# Patient Record
Sex: Female | Born: 1940 | Race: White | Marital: Married
Health system: Southern US, Community
[De-identification: ages and names within clinical notes are randomized; demographics above are authoritative.]

---

## 2006-01-25 ENCOUNTER — Other Ambulatory Visit: Payer: Self-pay

## 2006-02-01 ENCOUNTER — Inpatient Hospital Stay: Payer: Self-pay | Admitting: Unknown Physician Specialty

## 2008-10-28 ENCOUNTER — Inpatient Hospital Stay: Payer: Self-pay | Admitting: Internal Medicine

## 2008-12-25 ENCOUNTER — Ambulatory Visit: Payer: Self-pay | Admitting: Internal Medicine

## 2009-01-16 ENCOUNTER — Ambulatory Visit: Payer: Self-pay | Admitting: Internal Medicine

## 2009-01-25 ENCOUNTER — Ambulatory Visit: Payer: Self-pay | Admitting: Internal Medicine

## 2009-02-24 ENCOUNTER — Ambulatory Visit: Payer: Self-pay

## 2009-02-25 ENCOUNTER — Ambulatory Visit: Payer: Self-pay | Admitting: Internal Medicine

## 2009-05-19 ENCOUNTER — Ambulatory Visit: Payer: Self-pay | Admitting: Pain Medicine

## 2009-06-16 ENCOUNTER — Ambulatory Visit: Payer: Self-pay | Admitting: Physician Assistant

## 2009-06-30 ENCOUNTER — Ambulatory Visit: Payer: Self-pay | Admitting: Pain Medicine

## 2012-10-16 ENCOUNTER — Ambulatory Visit: Payer: Self-pay | Admitting: Family Medicine

## 2013-06-13 ENCOUNTER — Inpatient Hospital Stay: Payer: Self-pay | Admitting: Specialist

## 2013-06-13 DIAGNOSIS — R030 Elevated blood-pressure reading, without diagnosis of hypertension: Secondary | ICD-10-CM

## 2013-06-13 LAB — BASIC METABOLIC PANEL
Anion Gap: 6 — ABNORMAL LOW (ref 7–16)
Calcium, Total: 9.6 mg/dL (ref 8.5–10.1)
Chloride: 100 mmol/L (ref 98–107)
Co2: 29 mmol/L (ref 21–32)
EGFR (African American): >60
EGFR (Non-African Amer.): >60
Glucose: 140 mg/dL — ABNORMAL HIGH (ref 65–99)
Osmolality: 275 (ref 275–301)
Sodium: 135 mmol/L — ABNORMAL LOW (ref 136–145)

## 2013-06-13 LAB — URINALYSIS, COMPLETE
Bacteria: NONE SEEN
Bilirubin,UR: NEGATIVE
Blood: NEGATIVE
Hyaline Cast: 3
Ph: 5 (ref 4.5–8.0)
Protein: NEGATIVE
RBC,UR: 1 /HPF (ref 0–5)
Specific Gravity: 1.016 (ref 1.003–1.030)
Squamous Epithelial: 1
WBC UR: 2 /HPF (ref 0–5)

## 2013-06-13 LAB — CBC
MCHC: 32.6 g/dL (ref 32.0–36.0)
MCV: 87 fL (ref 80–100)
Platelet: 238 10*3/uL (ref 150–440)
RBC: 3.74 10*6/uL — ABNORMAL LOW (ref 3.80–5.20)
WBC: 11.7 10*3/uL — ABNORMAL HIGH (ref 3.6–11.0)

## 2013-06-13 LAB — PROTIME-INR: INR: 0.9

## 2013-06-13 LAB — TROPONIN I: Troponin-I: <0.02 ng/mL

## 2013-06-15 LAB — BASIC METABOLIC PANEL
Anion Gap: 4 — ABNORMAL LOW (ref 7–16)
Calcium, Total: 8.7 mg/dL (ref 8.5–10.1)
Co2: 30 mmol/L (ref 21–32)
Creatinine: 0.6 mg/dL (ref 0.60–1.30)
Glucose: 174 mg/dL — ABNORMAL HIGH (ref 65–99)
Osmolality: 280 (ref 275–301)
Sodium: 138 mmol/L (ref 136–145)

## 2013-06-15 LAB — CBC WITH DIFFERENTIAL/PLATELET
Basophil #: 0 x10 3/mm 3 (ref 0.0–0.1)
Basophil %: 0.1 %
Eosinophil #: 0 x10 3/mm 3 (ref 0.0–0.7)
Eosinophil %: 0 %
HCT: 31.5 % — ABNORMAL LOW (ref 35.0–47.0)
HGB: 10.5 g/dL — ABNORMAL LOW (ref 12.0–16.0)
Lymphocyte %: 7 %
Lymphs Abs: 0.8 x10 3/mm 3 — ABNORMAL LOW (ref 1.0–3.6)
MCH: 28.9 pg (ref 26.0–34.0)
MCHC: 33.3 g/dL (ref 32.0–36.0)
MCV: 87 fL (ref 80–100)
Monocyte #: 0.9 x10 3/mm (ref 0.2–0.9)
Monocyte %: 7.2 %
Neutrophil #: 10.3 x10 3/mm 3 — ABNORMAL HIGH (ref 1.4–6.5)
Neutrophil %: 85.7 %
Platelet: 162 x10 3/mm 3 (ref 150–440)
RBC: 3.63 X10 6/mm 3 — ABNORMAL LOW (ref 3.80–5.20)
RDW: 14.2 % (ref 11.5–14.5)
WBC: 12 x10 3/mm 3 — ABNORMAL HIGH (ref 3.6–11.0)

## 2013-06-16 LAB — BASIC METABOLIC PANEL
Anion Gap: 1 — ABNORMAL LOW (ref 7–16)
Chloride: 104 mmol/L (ref 98–107)
EGFR (African American): >60
Potassium: 3.8 mmol/L (ref 3.5–5.1)

## 2013-06-16 LAB — CBC WITH DIFFERENTIAL/PLATELET
Basophil #: 0 10*3/uL (ref 0.0–0.1)
Basophil %: 0.4 %
Eosinophil #: 0.3 10*3/uL (ref 0.0–0.7)
Eosinophil %: 2.9 %
HCT: 32.1 % — ABNORMAL LOW (ref 35.0–47.0)
HGB: 10.5 g/dL — ABNORMAL LOW (ref 12.0–16.0)
Lymphocyte #: 2 10*3/uL (ref 1.0–3.6)
Lymphocyte %: 16.8 %
MCH: 28.7 pg (ref 26.0–34.0)
Monocyte %: 9.7 %
Neutrophil #: 8.3 10*3/uL — ABNORMAL HIGH (ref 1.4–6.5)
Neutrophil %: 70.2 %
RBC: 3.65 10*6/uL — ABNORMAL LOW (ref 3.80–5.20)
WBC: 11.8 10*3/uL — ABNORMAL HIGH (ref 3.6–11.0)

## 2013-06-20 ENCOUNTER — Encounter: Payer: Self-pay | Admitting: Internal Medicine

## 2013-06-27 ENCOUNTER — Encounter: Payer: Self-pay | Admitting: Internal Medicine

## 2014-10-17 NOTE — Op Note (Signed)
PATIENT NAME:  Sierra [name], Sierra [name] MR#:  [id number] DATE OF BIRTH:  03-Jul-1940  DATE OF PROCEDURE:  06/14/2013  PREOPERATIVE DIAGNOSIS: Transverse fractures of the proximal left tibia and fibula.   POSTOPERATIVE DIAGNOSIS: Transverse fractures of the proximal left tibia and fibula.   PROCEDURE PERFORMED: Open reduction, internal fixation, left tibia fracture (Synthes 3.5 mm proximal locking tibial plate, 9 locking screws, and two cortical screws).   SURGEON: Valinda HoarHoward E. Lutie Pickler, [name].D.   ANESTHESIA: Spinal.   COMPLICATIONS: None.   DRAINS: Two Hemovacs.   ESTIMATED BLOOD LOSS: 100 mL   REPLACED: None in the Operating Room.   DESCRIPTION OF PROCEDURE: The patient was brought to the Operating Room, where she underwent satisfactory spinal anesthesia and was placed in the supine position on the fracture table. The left leg was prepped and draped in sterile fashion. An Esmarch was applied. The tourniquet was inflated to 350 mmHg. Tourniquet time was 81 minutes.   A hockey stick shaped incision was made of the proximal lateral aspect of the tibia, and dissection carried out bluntly to subcutaneous tissue. Large amount of hematoma was removed. The lateral border of the tibia was exposed and dissection was brought up proximally on the lateral tibial plateau. The fracture site was identified and was distracted. It was irrigated and curetted free of clot. The fracture was reduced manually, in good position.   An eight hole, 3.5 mm proximal tibial locking plate was applied to the lateral side of the tibia, and initial fixation made with a cortical screw below the fracture site. The fracture was reduced, and the fluoroscopy showed the hardware to be in good position. The plate was then affixed to the proximal and distal fragments, using locking screws proximally and a combination of locking and cortical screws distally. The fracture site appeared to be well reduced on AP and lateral views, and hardware was  all in good position.   The wound was irrigated and closed with 0 Quill over a medium Hemovac drain in the muscle compartment and 0 Quill over another Hemovac drain in the subcutaneous tissue. Skin was closed with staples. A dry sterile dressing, Polar Care and knee immobilizer were reapplied. The tourniquet was deflated, with good return of blood flow to the foot. The patient was transferred to her hospital bed and taken to the recovery room in satisfactory position.   The patient was hypotensive, and it was felt that, since her preoperative hemoglobin was only 8.6, and since she was going to bleed significantly from this surgery, that transfusion with 2 units of packed red blood cells was indicated. These were supposed to be started in the Operating Room, but due to delay, they will be started in the PACU.     ____________________________ Valinda HoarHoward E. Pink Maye, MD hem:cg D: 06/14/2013 16:54:28 ET T: 06/15/2013 01:30:31 ET JOB#: [id number]  cc: Valinda HoarHoward E. Stelios Kirby, MD, <Dictator> Valinda HoarHOWARD E Daurice Ovando MD ELECTRONICALLY SIGNED 06/15/2013 17:16

## 2014-10-17 NOTE — H&P (Signed)
Subjective/Chief Complaint Pain left leg   History of Present Illness 74 year old female fell at home last night when she slipped in kitchen.  Did not come to Emergency Room until this AM where exam and X-rays show a displaced proximal transverse fracture of the left tibia and fibula.  Admitted for surgical stabilization and medical evaluation.  Cleared by Dr Posey Pronto medically for surgery.  Risks and benefits of surgery were discussed at length including but not limited to infection, non union, nerve or blood vessed damage, non union, need for repeat surgery, blood clots and lung emboli, and death.  Plan open reduction and internal fixation of left tibia tomorrow,   Past Med/Surgical Hx:  anxiety disorder:   catracts:   breast  limps and cysts:   anemia:   osteoporosis:   gerd:   depression:   htn:   lens replacement of eyes:   tonsilectomy:   tonsillectomy:   back  repair:   carpal tunnel surgery:   right rotator cuff repair:   right total knee replacement:   ALLERGIES:  Latex: Blisters  Family and Social History:  Family History Non-Contributory   Social History negative tobacco, negative ETOH   Place of Living Home   Review of Systems:  Fever/Chills No   Cough No   Sputum No   Abdominal Pain No   Physical Exam:  GEN well developed, well nourished, no acute distress   HEENT pink conjunctivae   NECK supple   RESP normal resp effort   CARD regular rate   ABD denies tenderness   LYMPH negative neck   EXTR Left leg painful to palpation at knee and below.  Ecchymosis present.  circulation/sensation/motor function good and skin intact.  Some distal edema.  Pain with range of motion.   SKIN normal to palpation   NEURO motor/sensory function intact   PSYCH alert, A+O to time, place, person, good insight   Lab Results: Routine BB:  18-Dec-14 14:07   ABO Group + Rh Type O Positive  Antibody Screen NEGATIVE (Result(s) reported on 13 Jun 2013 at 04:43PM.)   Routine Chem:  18-Dec-14 10:41   Glucose, Serum  140  BUN  19  Creatinine (comp) 0.65  Sodium, Serum  135  Potassium, Serum 3.8  Chloride, Serum 100  CO2, Serum 29  Calcium (Total), Serum 9.6  Anion Gap  6  Osmolality (calc) 275  eGFR (African American) >60  eGFR (Non-African American) >60 (eGFR values <48m/min/1.73 m2 may be an indication of chronic kidney disease (CKD). Calculated eGFR is useful in patients with stable renal function. The eGFR calculation will not be reliable in acutely ill patients when serum creatinine is changing rapidly. It is not useful in  patients on dialysis. The eGFR calculation may not be applicable to patients at the low and high extremes of body sizes, pregnant women, and vegetarians.)  Cardiac:  18-Dec-14 14:07   CK, Total 93  CPK-MB, Serum 2.5 (Result(s) reported on 13 Jun 2013 at 02:42PM.)  Troponin I < 0.02 (0.00-0.05 0.05 ng/mL or less: NEGATIVE  Repeat testing in 3-6 hrs  if clinically indicated. >0.05 ng/mL: POTENTIAL  MYOCARDIAL INJURY. Repeat  testing in 3-6 hrs if  clinically indicated. NOTE: An increase or decrease  of 30% or more on serial  testing suggests a  clinically important change)  Routine UA:  18-Dec-14 11:50   Color (UA) Yellow  Clarity (UA) Clear  Glucose (UA) Negative  Bilirubin (UA) Negative  Ketones (UA) Negative  Specific Gravity (UA) 1.016  Blood (UA) Negative  pH (UA) 5.0  Protein (UA) Negative  Nitrite (UA) Negative  Leukocyte Esterase (UA) Negative (Result(s) reported on 13 Jun 2013 at 12:16PM.)  RBC (UA) 1 /HPF  WBC (UA) 2 /HPF  Bacteria (UA) NONE SEEN  Epithelial Cells (UA) 1 /HPF  Mucous (UA) PRESENT  Hyaline Cast (UA) 3 /LPF (Result(s) reported on 13 Jun 2013 at 12:16PM.)  Routine Coag:  18-Dec-14 10:41   Prothrombin 12.3  INR 0.9 (INR reference interval applies to patients on anticoagulant therapy. A single INR therapeutic range for coumarins is not optimal for all indications; however,  the suggested range for most indications is 2.0 - 3.0. Exceptions to the INR Reference Range may include: Prosthetic heart valves, acute myocardial infarction, prevention of myocardial infarction, and combinations of aspirin and anticoagulant. The need for a higher or lower target INR must be assessed individually. Reference: The Pharmacology and Management of the Vitamin K  antagonists: the seventh ACCP Conference on Antithrombotic and Thrombolytic Therapy. CBSWH.6759 Sept:126 (3suppl): N9146842. A HCT value >55% may artifactually increase the PT.  In one study,  the increase was an average of 25%. Reference:  "Effect on Routine and Special Coagulation Testing Values of Citrate Anticoagulant Adjustment in Patients with High HCT Values." American Journal of Clinical Pathology 2006;126:400-405.)  Routine Hem:  18-Dec-14 10:41   WBC (CBC)  11.7  RBC (CBC)  3.74  Hemoglobin (CBC)  10.6  Hematocrit (CBC)  32.6  Platelet Count (CBC) 238 (Result(s) reported on 13 Jun 2013 at 11:33AM.)  MCV 87  MCH 28.4  MCHC 32.6  RDW 13.8   Radiology Results: XRay:    18-Dec-14 09:50, Tibia And Fibula Left  Tibia And Fibula Left  REASON FOR EXAM:    Fall  COMMENTS:       PROCEDURE: DXR - DXR TIBIA AND FIBULA LT (LOWER L  - Jun 13 2013  9:50AM     CLINICAL DATA:  Status post fall    EXAM:  LEFT TIBIA AND FIBULA - 2 VIEW    COMPARISON:  None.    FINDINGS:  There is a transversemildly comminuted fracture of the proximal  left tibial metadiaphysis with 8 mm of medial displacement and 6 mm  of posterior displacement. There is a mildly comminuted fracture of  the proximal fibular metadiaphysis. There is mild soft tissue edema.    IMPRESSION:  Transverse mildly comminuted fracture of the proximal left tibial  metadiaphysis with 8 mm of medial displacement and 6 mm of posterior  displacement.    Mildly comminuted fracture of the proximal fibular metadiaphysis.      Electronically Signed     By: Kathreen Devoid    On: 06/13/2013 10:25     Verified By: Jennette Banker, M.D., MD  LabUnknown:  PACS Image    Assessment/Admission Diagnosis Unstable left proximal tibia/fibula fractures.   Plan open reduction and internal fixation left tibia tomorrow.   Electronic Signatures: Park Breed (MD)  (Signed 18-Dec-14 17:57)  Authored: CHIEF COMPLAINT and HISTORY, PAST MEDICAL/SURGIAL HISTORY, ALLERGIES, FAMILY AND SOCIAL HISTORY, REVIEW OF SYSTEMS, PHYSICAL EXAM, LABS, Radiology, ASSESSMENT AND PLAN   Last Updated: 18-Dec-14 17:57 by Park Breed (MD)

## 2014-10-17 NOTE — Discharge Summary (Signed)
PATIENT NAME:  Sierra [name], Sierra [name] MR#:  [id number] DATE OF BIRTH:  17-Aug-1940  DATE OF ADMISSION:  06/13/2013 DATE OF DISCHARGE:  06/18/2013  FINAL DIAGNOSES: 1.  Comminuted displaced fracture of left proximal tibia/fibula.  2.  Anxiety disorder.  3.  Anemia.  4.  Osteoporosis.  5.  Gastroesophageal reflux disease.  6.  Depression.  7.  Hypertension.  OPERATION: On 06/14/2013, open reduction and internal fixation, left proximal tibia with a Synthes proximal tibial locking plate.   COMPLICATIONS: None.   CONSULTATIONS: None.   DISCHARGE MEDICATIONS: Norco 7.5/325 p.r.n. pain, Neurontin 400 mg b.i.d., enteric-coated aspirin 1 p.o. b.i.d., iron 1 p.o. daily, milk of magnesia p.r.n.   HISTORY OF PRESENT ILLNESS: The patient is a 74 year old female who tripped and fell in her kitchen the night before she was admitted. She had pain and swelling in the leg and was brought to the Emergency Room the next day where exam and x-rays revealed transverse, mildly displaced fractures of the left proximal tibia and fibula. No other orthopedic injuries were noted. The patient was admitted for medical evaluation and clearance and surgical fixation of the fracture. She lives alone, and it was felt that she would need extensive rehab before she could go home again.    PAST MEDICAL HISTORY AND ILLNESSES:  As above.  ALLERGIES: LATEX.   HOME MEDICATIONS:  None were listed.   OPERATIONS:  Cataract surgery, tonsillectomy, back surgery, carpal tunnel surgery, right rotator cuff surgery, right total knee replacement.   FAMILY HISTORY: Unremarkable.   SOCIAL HISTORY: The patient lives alone. Does not smoke or drink.   REVIEW OF SYSTEMS: Unremarkable.   PHYSICAL EXAMINATION: Vital signs were normal. The patient was alert and cooperative. The left leg was swollen and ecchymotic.  The neurovascular status was good. The fracture was unstable, and there was pain with movement. The skin was intact.    Laboratory data on admission was satisfactory.   HOSPITAL COURSE:  On 06/14/2013, the patient was taken to surgery where she underwent an open reduction and internal fixation of the left proximal tibia. Her hemoglobin had dropped to 8.6 on the day of surgery, and she was somewhat hypotensive. She was transfused 2 units of packed cells. Postoperatively, she did well. The Hemovac was removed on the first postop day. Her hemoglobin increased to 10.5 on the first and second postop days. She was gradually mobilized. She was making very slow progress with PT. She is markedly overweight with a BMI of 38 and poor muscle strength; will require extensive skilled nursing stay prior to returning home. We are awaiting clearance from the insurance company for skilled nursing placement, but she is stable and ready for discharge today, 06/18/2013. She is to be seen in my office in 2 weeks for exam. She is to be touchdown weight-bearing on the left leg only.    ____________________________ Sierra HoarHoward E. Edwinna Rochette, MD hem:dmm D: 06/18/2013 10:52:50 ET T: 06/18/2013 11:08:16 ET JOB#: [id number]  cc: Sierra HoarHoward E. Schneur Crowson, MD, <Dictator> Rhona LeavensJames F. Burnett ShengHedrick, MD Sierra HoarHOWARD E Reka Wist MD ELECTRONICALLY SIGNED 06/19/2013 8:50

## 2014-10-17 NOTE — Consult Note (Signed)
Brief Consult Note: Diagnosis: left proximal tibial-fibulae and left medial malleolus fracture s/p accidental fall y'day at home, COPD on 2 liter [place] oxygen, Elelvated BP w/o dx of HTN.   Patient was seen by consultant.   Consult note dictated.   Recommend to proceed with surgery or procedure.   Comments: -check EKG -pt has no cardiac history of MI or HTN. Her bp is elelvated likley due to pain. will start bp meds if remains elevated. -OK to proceed for sugery. low to intermediate risk -DVT prophylaxis -Incentive spirometry.  Electronic Signatures: Willow OraPatel, Mancel Lardizabal A (MD)  (Signed 18-Dec-14 12:40)  Authored: Brief Consult Note   Last Updated: 18-Dec-14 12:40 by Willow OraPatel, Evangela Heffler A (MD)

## 2014-10-18 NOTE — Consult Note (Signed)
PATIENT NAME:  Sierra [name], Sierra [name] MR#:  [id number] DATE OF BIRTH:  1941/03/21  DATE OF CONSULTATION:  06/13/2013  REFERRING PHYSICIAN:  Dr. Deeann SaintHoward Miller CONSULTING PHYSICIAN:  Kunaal Walkins A. Allena KatzPatel, MD  PRIMARY CARE PHYSICIAN:  Dr. Tera MaterJim Hedrick  REASON FOR CONSULTATION:  Preop medical clearance. The patient is status post accidental fall at home.  Sierra [name] is a 74 year old Caucasian female with past medical history of COPD on 2 liter nasal cannula oxygen continuous, along with history of DJD with spinal stenosis and back surgery in the past, comes to the Emergency Room after she had sustained a fall in her kitchen when she slipped over plastic material down in her kitchen. The patient started having pain in her left leg. She heard the knee "pop," came to the Emergency Room and was found to have left proximal tibia-fibular fracture and avulsion fracture of the left malleolus. The patient is seen by orthopedist Dr. Hyacinth MeekerMiller, is scheduled to get open reduction and internal fixation and internal medicine was consulted for preoperative medical clearance.  The patient lives at home by herself. She drives. She uses a cane to walk around secondary to DJD and back pain. She also uses 2 liters of nasal cannula oxygen. She is otherwise active, does not have any cardiac history or any history of MI, arrhythmia or hypertension in the past. She does not take any medications other than occasionally BC powders. She has some shortness of breath, but that is due to her chronic COPD, for which she uses oxygen.   PAST MEDICAL HISTORY: 1.  COPD on 2 liters nasal cannula oxygen.  2.  DJD. 3.  Total right knee replacement.  4.  Back surgery.  5.  Spinal stenosis of lower back.  ALLERGIES: LATEX.  OUTPATIENT MEDICATIONS: No current medications except occasionally BC powders over-the-counter. The patient denies use of any aspirin, NSAIDs or any multivitamins either.   FAMILY HISTORY: Positive for lung cancer in  grandfather and mother. Grandmother had brain tumor.   SOCIAL HISTORY: She lives at home by herself, uses 2 liters of nasal cannula oxygen. She is an ex-smoker, denies alcohol use.  REVIEW OF SYSTEMS:   CONSTITUTIONAL: Positive. No fever, fatigue, weakness.  EYES: No blurred or double vision, glaucoma or cataracts.  ENT: No tinnitus, ear pain, hearing loss.  RESPIRATORY: No cough, wheeze, hemoptysis. Positive for COPD.  CARDIOVASCULAR: No chest pain, orthopnea, edema. Positive for dyspnea on exertion occasionally.  GASTROINTESTINAL: No nausea, vomiting, diarrhea, abdominal pain.  GENITOURINARY: No dysuria, hematuria or frequency.  ENDOCRINE: No polyuria, nocturia or thyroid problems.  HEMATOLOGY: No anemia or easy bruising.  SKIN: No acne or rash.  MUSCULOSKELETAL: Positive for arthritis, back pain which limits sometimes her activities.  NEUROLOGIC: No CVA, TIA, ataxia or dementia.  PSYCHIATRIC: No anxiety or depression. All other systems reviewed are negative.   PHYSICAL EXAMINATION: GENERAL: The patient is awake, alert, oriented x 3, not in acute distress.  VITAL SIGNS: Afebrile. Pulse is 91. Blood pressure is 142/65. Sats are 95% on 2 liters nasal cannula oxygen.  HEENT: Atraumatic, normocephalic. Pupils PERRLA. EOMI intact. Oral mucosa is dry.  NECK: Supple. No JVD. No carotid bruit.  RESPIRATORY: Clear to auscultation bilaterally. No rales, rhonchi, respiratory distress or labored breathing.  CARDIOVASCULAR: Both heart sounds are normal. Mild tachycardia present. No murmur heard. PMI not lateralized. CHEST:  Nontender.  EXTREMITIES: Good pedal pulses, good femoral pulses. No lower extremity edema.  ABDOMEN: Soft, benign, nontender. No organomegaly. Positive bowel sounds.  NEUROLOGIC:  Grossly intact cranial nerves II through XII. No motor or sensory deficit. The patient has left lower extremities which was swollen at the knee and with painful range of motion secondary to fracture.   PSYCHIATRIC: The patient is awake, alert, oriented x 3.  SKIN: Warm and dry.  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 No EKG to compare.  ASSESSMENT: A 74 year old Sierra [name] with history of chronic obstructive pulmonary disease on 2 liters nasal cannula oxygen comes to the Emergency Room after she had sustained a mechanical/accidental fall in the kitchen of her house. She is being admitted with:  1.  Left proximal tibia-fibula fracture along with avulsion fracture of the left malleolus status post mechanical/accidental fall at home. Management per Dr. Deeann Saint.  2.  Preoperative medical clearance. The patient is okay to go for surgery. She does not have any cardiac history of any arrhythmias, myocardial infarction or history of hypertension. She is currently asymptomatic. Denies any chest pain. Her EKG shows bifascicular block. I have no older EKG to compare. I will order a stat cardiac panel to ensure it is stable. Her electrolytes are within normal limits at this time. The patient is at a low to intermediate risk for surgery at this time.  2.  Chronic obstructive pulmonary disease on 2 liters nasal cannula oxygen. Would recommend continue oxygen after reviewing continuously. Recommend also postoperative incentive spirometer and nebulizers as needed.  3.  Deep vein prophylaxis per Dr. Deeann Saint.  4.  Elevated blood pressure without diagnosis of hypertension. This is likely in the setting of fracture and pain. Will continue to monitor. Blood pressure if remains elevated consider starting p.o. antihypertensives.  5.  Further work-up within the patient's clinical course. Risks and benefits of surgery were discussed with the patient and the patient's family members.   TIME SPENT: 50 minutes.  Thank you for the consult. Will follow while the patient is in-house.    ____________________________ Wylie Hail. Allena Katz, MD sap:ce D: 06/13/2013 13:00:23 ET T: 06/13/2013 13:23:56  ET JOB#: [id number]  cc: Raymar Joiner A. Allena Katz, MD, <Dictator> Rhona Leavens. Burnett Sheng, MD Willow Ora MD ELECTRONICALLY SIGNED 07/22/2013 15:06

## 2014-10-18 NOTE — Consult Note (Signed)
PATIENT NAME:  Sierra [name], Sierra [name] MR#:  [id number] DATE OF BIRTH:  [birth year]  DATE OF CONSULTATION:  06/13/2013  REFERRING PHYSICIAN:   CONSULTING PHYSICIAN:  Loa Idler A. Allena KatzPatel, MD  ADDENDUM  [facility], Dr. Webb SilversmithHedrick's notes. The patient does have a history of hypertension and is supposed to be on lisinopril as per medication list 5 mg p.o. daily. However, the patient reported she does not take any medications but BC powders. She also has a history of depression, anxiety, and was on buspirone, clonazepam in the past. Reviewing her echo that was done December 2005, she has an EF of 60%, trivial to mild mitral and tricuspid regurgitation. I was unable to locate any new EKG [facility]. .  ____________________________ Wylie HailSona A. Allena KatzPatel, MD sap:jm D: 06/13/2013 14:09:18 ET T: 06/13/2013 14:36:02 ET JOB#: [id number]  cc: Abram Sax A. Allena KatzPatel, MD, <Dictator> Willow OraSONA A Fergus Throne MD ELECTRONICALLY SIGNED 07/22/2013 15:06

## 2015-01-17 IMAGING — CR DG ANKLE COMPLETE 3+V*L*
1 series · 3 of 3 positions shown · non-contrast
Comparison: None.

CLINICAL DATA: Fall.

EXAM:
LEFT ANKLE COMPLETE - 3+ VIEW

[Series 1: x ankle ap left · 0.14mm/px · 3 of 3 slices shown]
[im 1/3]
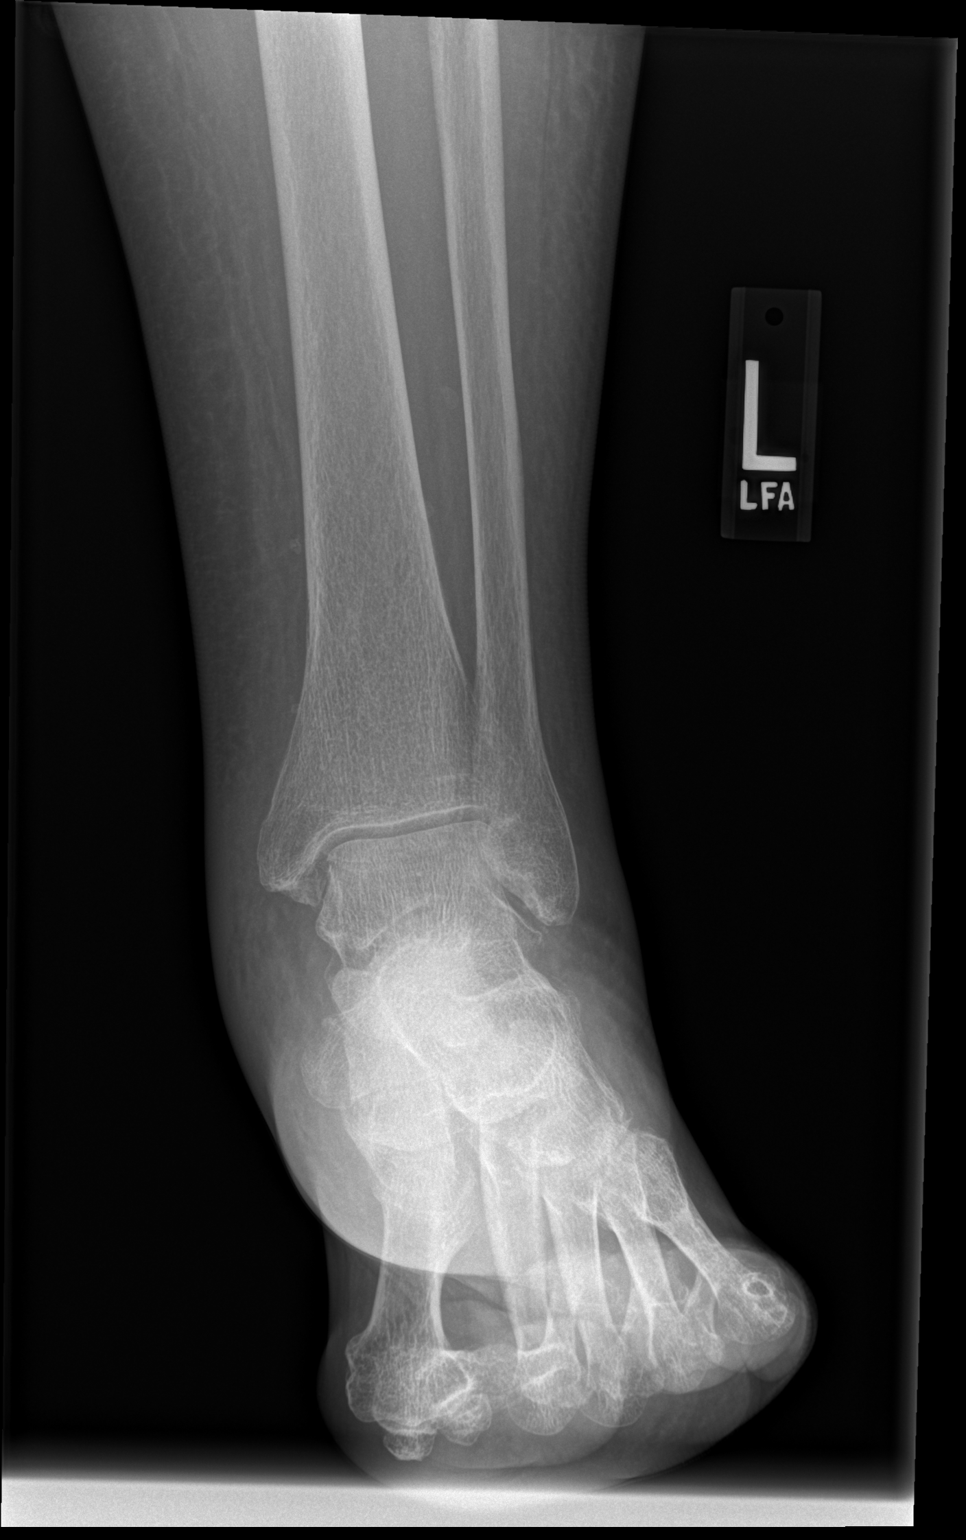
[im 2/3]
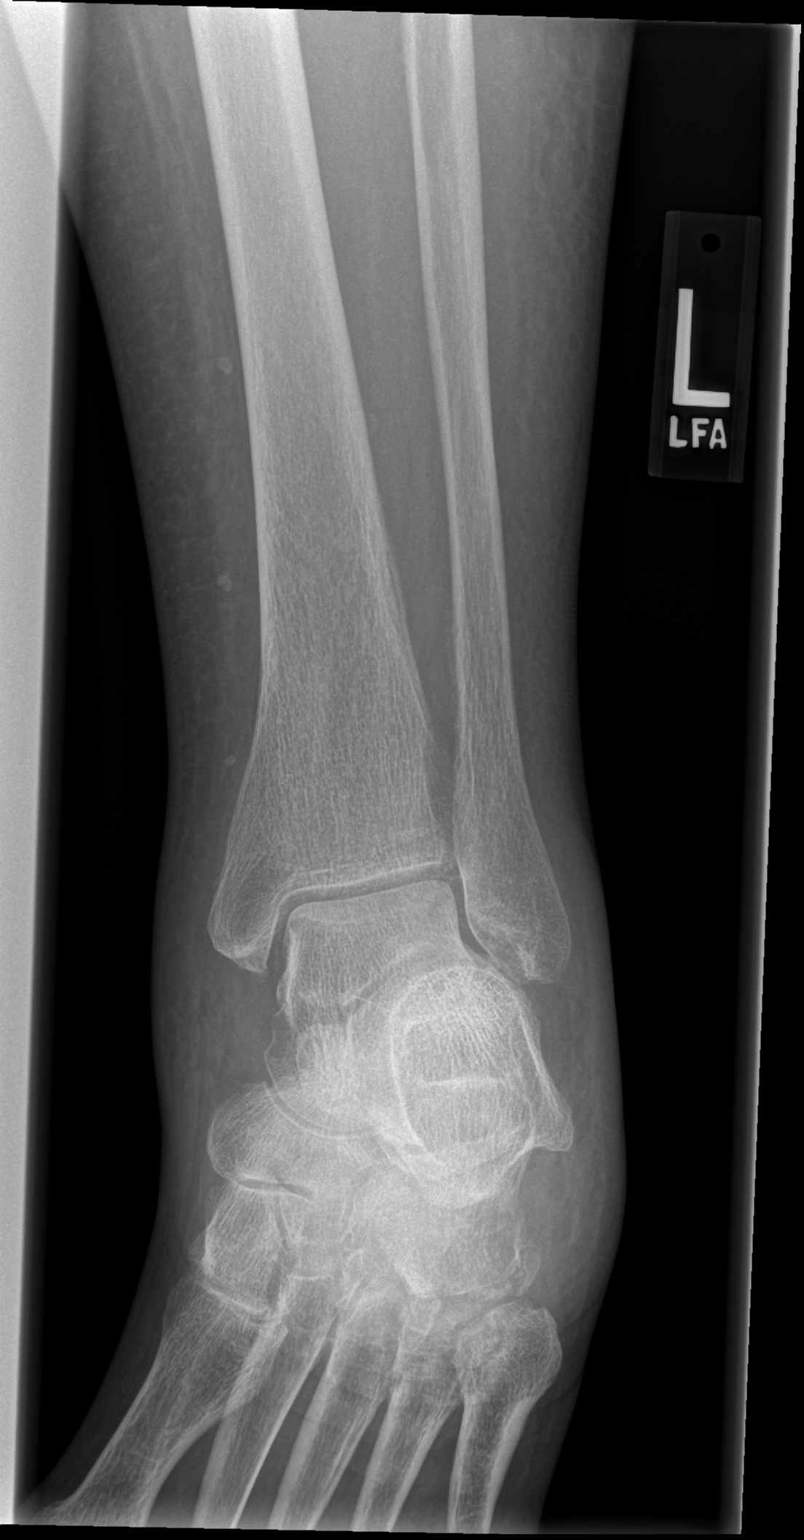
[im 3/3]
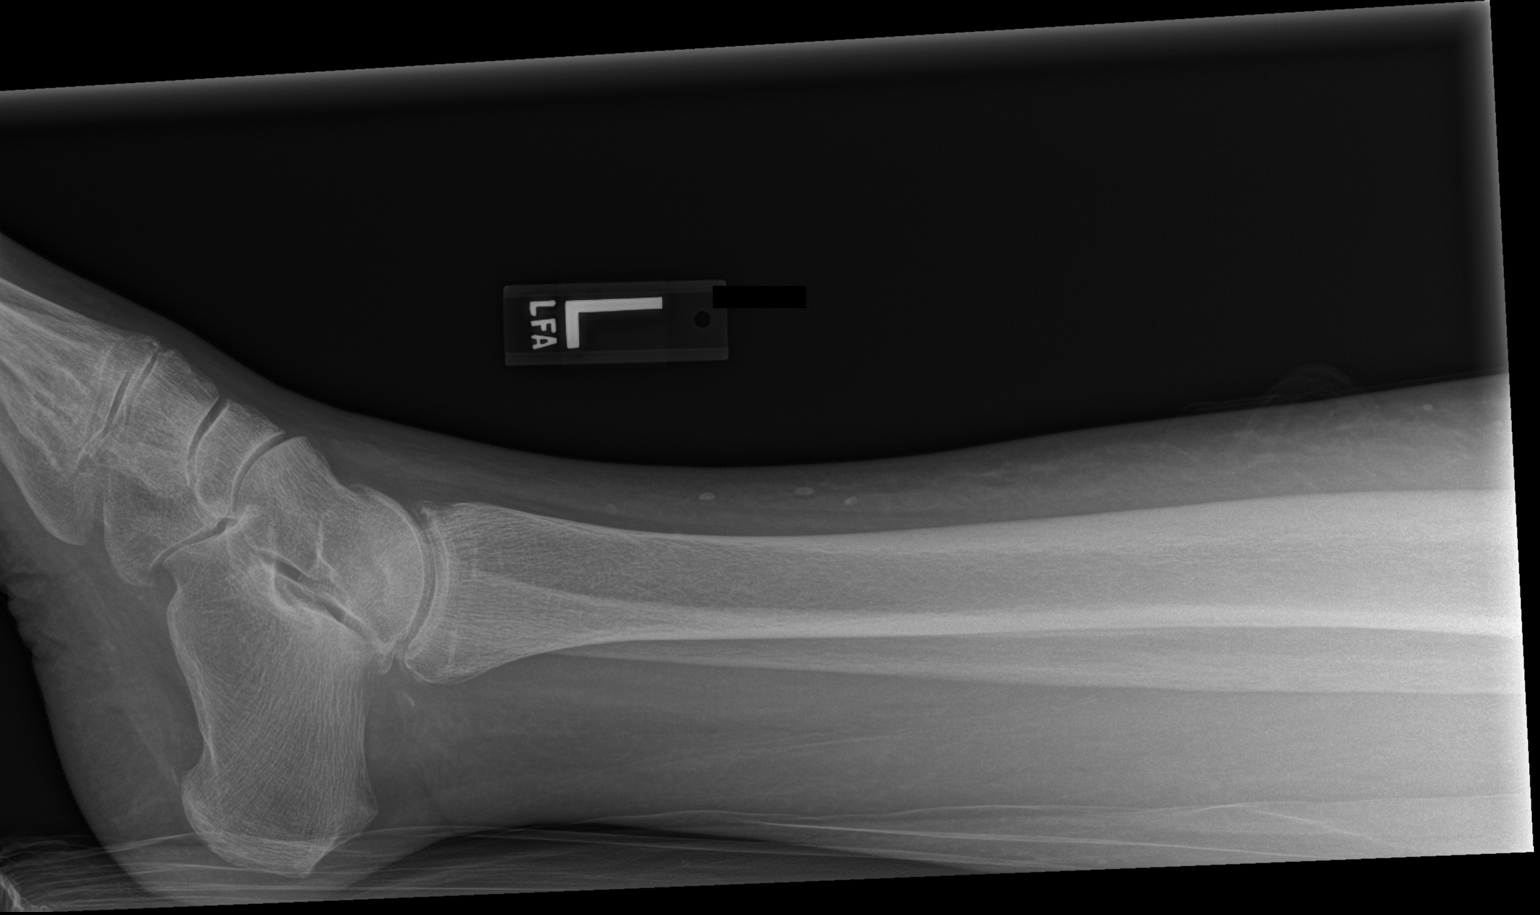

[3 of 3 positions shown; findings below may reference images not displayed]

FINDINGS: Soft tissue swelling is noted diffusely about the left ankle,
particularly about the medial malleolus. A subtle avulsion fracture
of the distal portion of the medial malleolus cannot be excluded.
Diffuse osteopenia and degenerative change present. Vascular
calcifications are present. These are most likely venous.
IMPRESSION: Prominent soft tissue swelling noted about the left ankle. A subtle
tiny avulsion fracture from the medial malleolus cannot be
completely excluded.

## 2015-01-17 IMAGING — CR DG TIBIA/FIBULA 2V*L*
1 series · 2 of 2 positions shown · non-contrast
Comparison: None.

CLINICAL DATA: Status post fall

EXAM:
LEFT TIBIA AND FIBULA - 2 VIEW

[Series 8: x tib-fib ap left · 0.14mm/px · 2 of 2 slices shown]
[im 1/2]
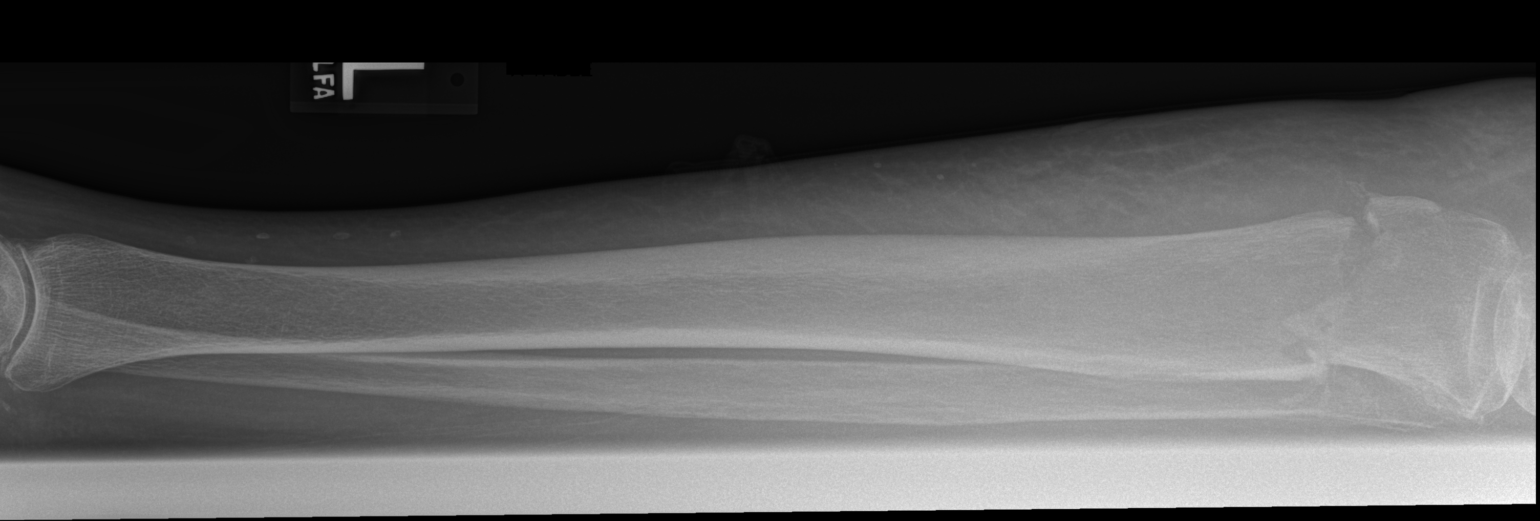
[im 2/2]
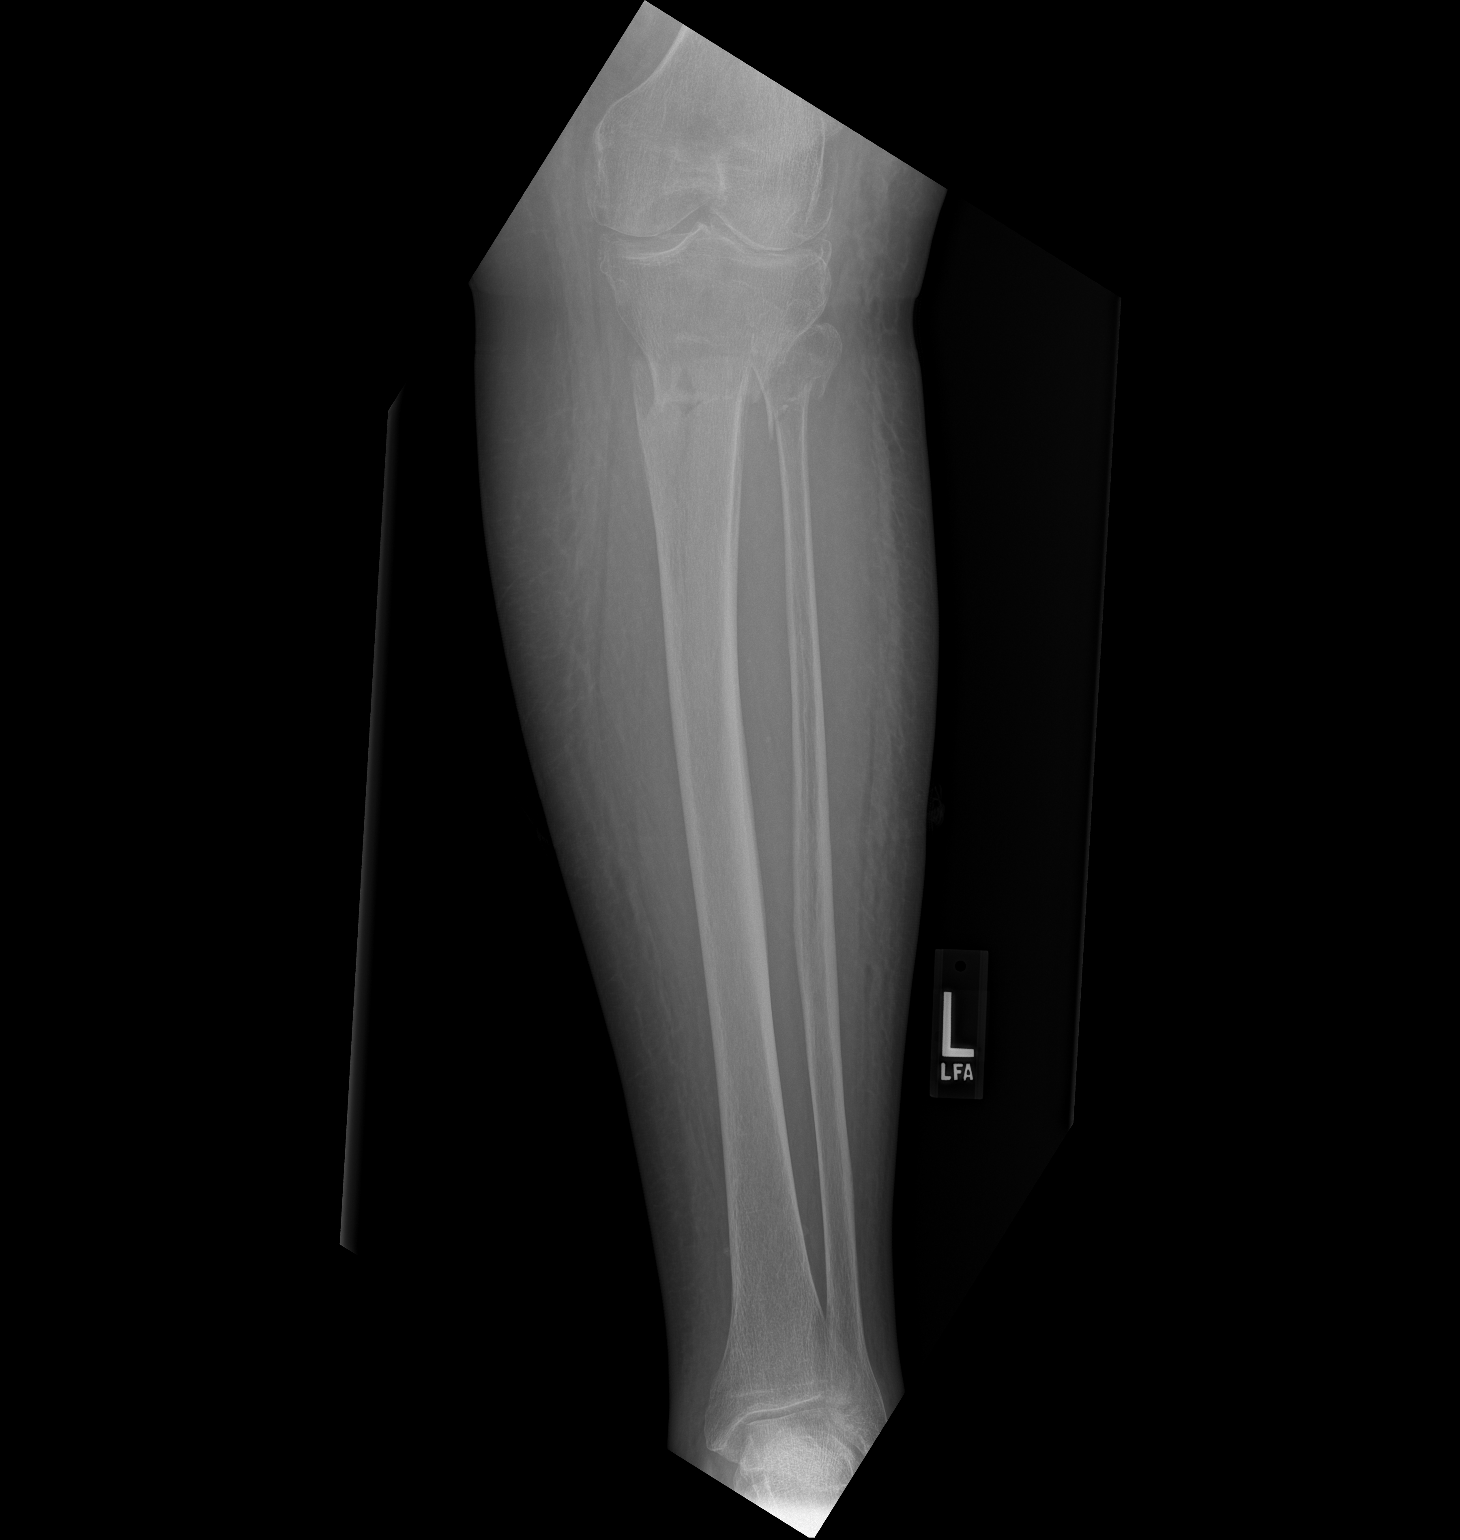

[2 of 2 positions shown; findings below may reference images not displayed]

FINDINGS: There is a transverse mildly comminuted fracture of the proximal
left tibial metadiaphysis with 8 mm of medial displacement and 6 mm
of posterior displacement. There is a mildly comminuted fracture of
the proximal fibular metadiaphysis. There is mild soft tissue edema.
IMPRESSION: Transverse mildly comminuted fracture of the proximal left tibial
metadiaphysis with 8 mm of medial displacement and 6 mm of posterior
displacement.

Mildly comminuted fracture of the proximal fibular metadiaphysis.

## 2015-01-17 IMAGING — CR DG CHEST 2V
1 series · 2 of 2 positions shown · non-contrast
Comparison: 02/24/2009 and chest CT 10/28/2008.

CLINICAL DATA: Preop.

EXAM:
CHEST  2 VIEW

[Series 10: x chest ap · 0.14mm/px · 2 of 2 slices shown]
[im 1/2]
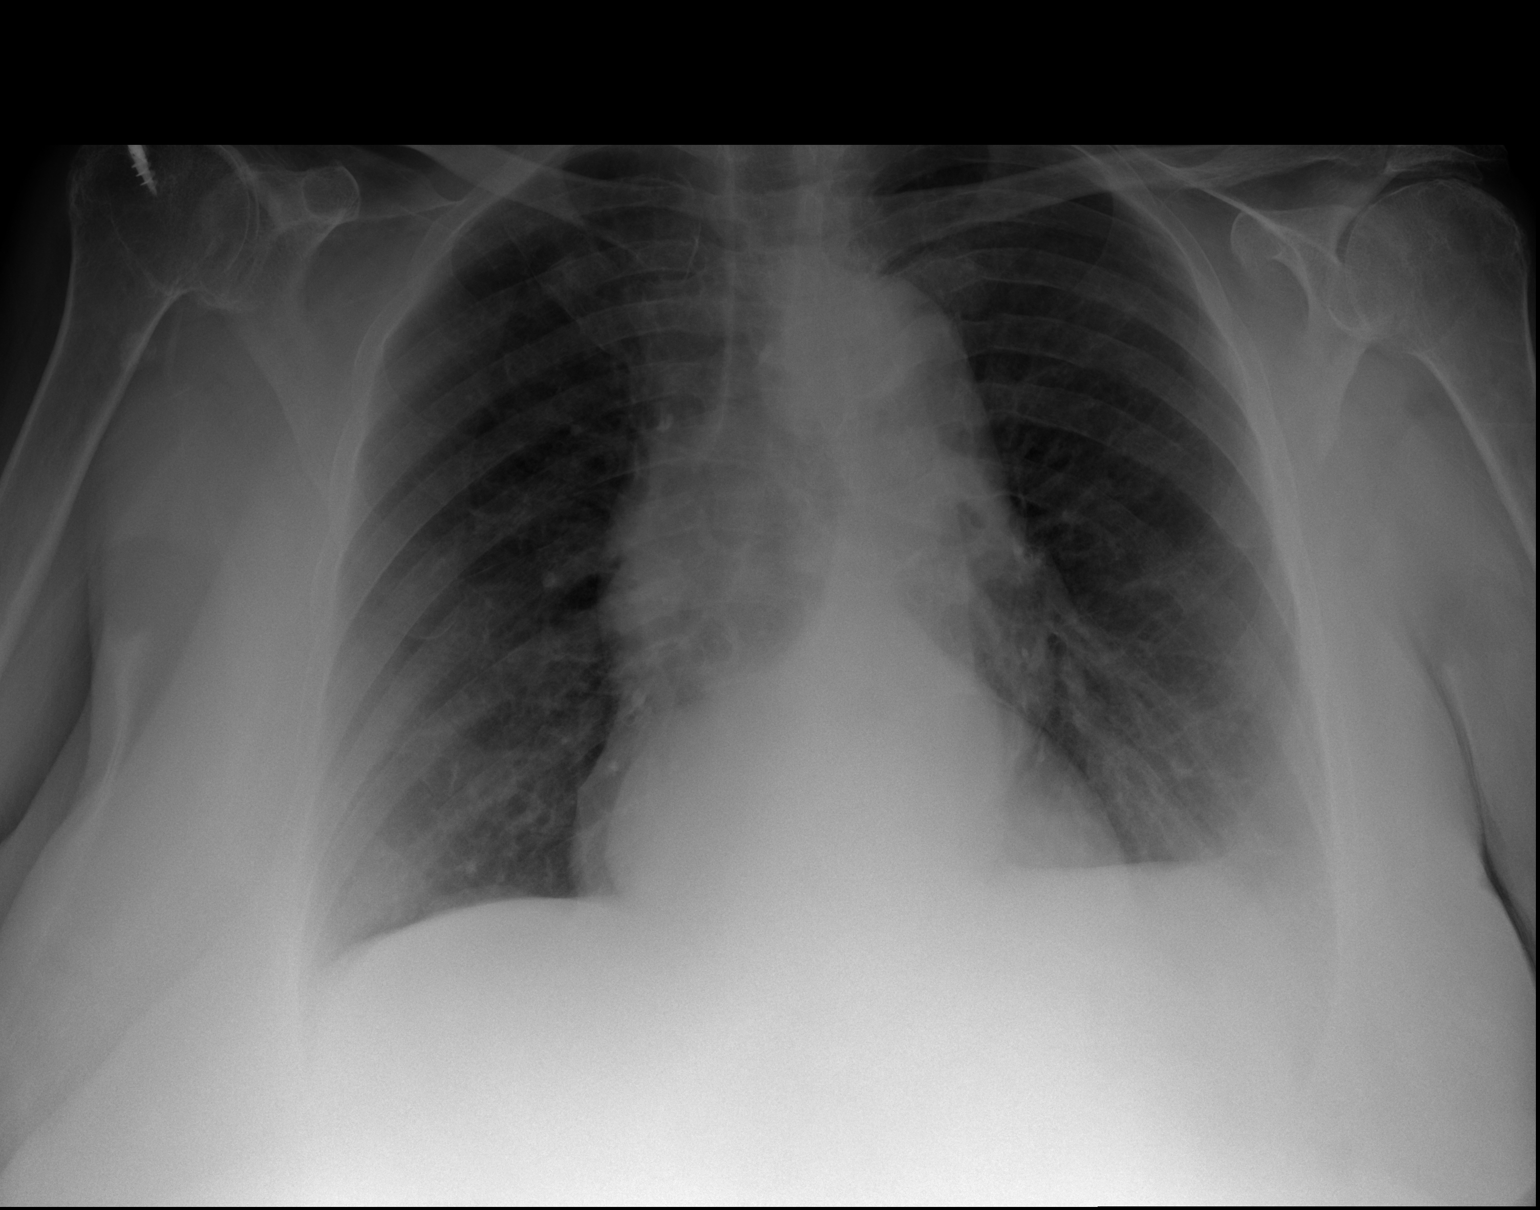
[im 2/2]
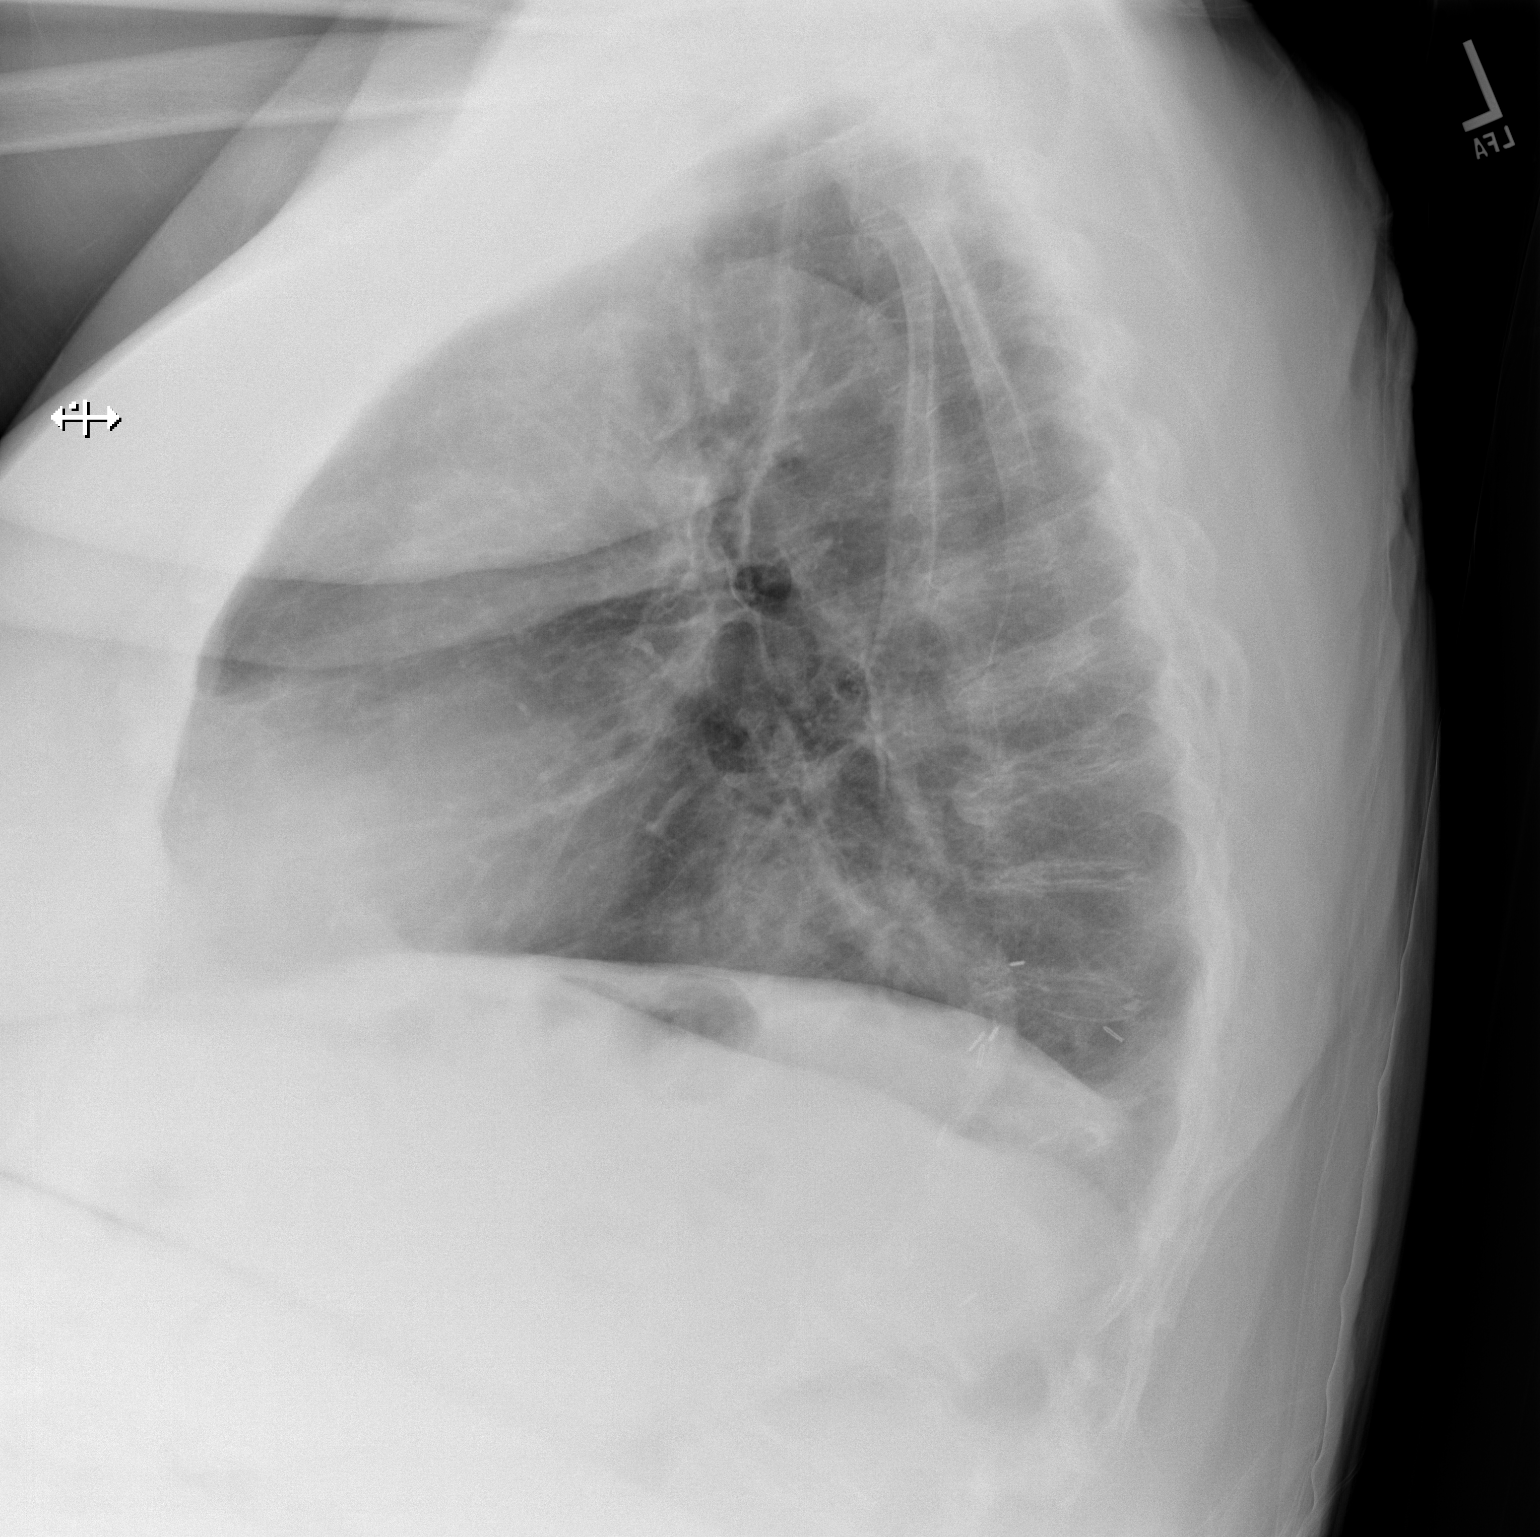

[2 of 2 positions shown; findings below may reference images not displayed]

FINDINGS: Lungs are adequately inflated and otherwise clear. There is evidence
of patient's moderate size hiatal hernia unchanged.
Cardiomediastinal silhouette is unchanged to include mild tortuosity
of the thoracic aorta with calcified plaque over the aortic arch.
The there is spondylosis of the spine. Surgical anchors seen over
the right humeral head.
IMPRESSION: No active cardiopulmonary disease.

Moderate size hiatal hernia.

## 2015-01-18 IMAGING — XA DG C-ARM 1-60 MIN
2 series · 2 of 2 positions shown · non-contrast
Comparison: 06/13/2013

CLINICAL DATA: Internal fixation

EXAM:
DG C-ARM 1-60 MIN

[Series 11: cont. · 1 of 1 slices shown (1 of 2)]
[im 1/1]
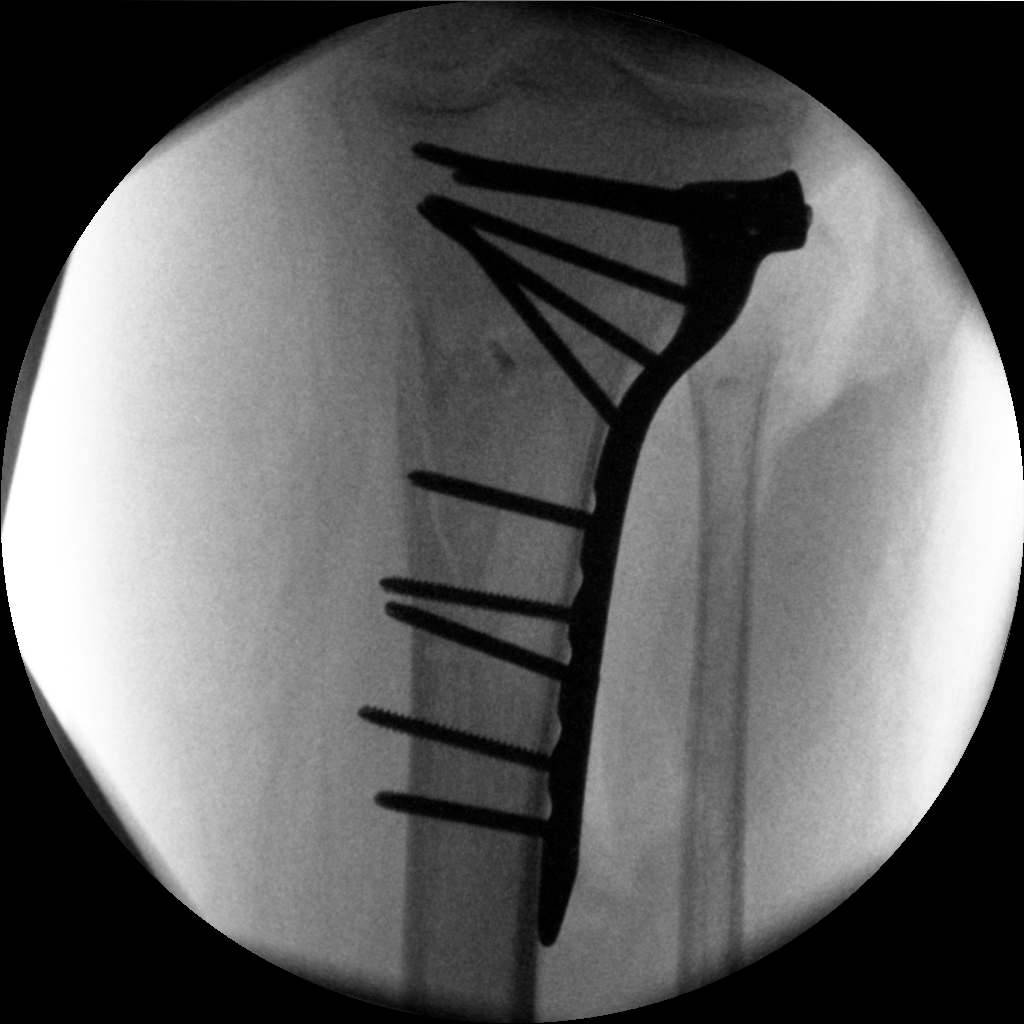

[Series 14: cont. · 1 of 1 slices shown (2 of 2)]
[im 1/1]
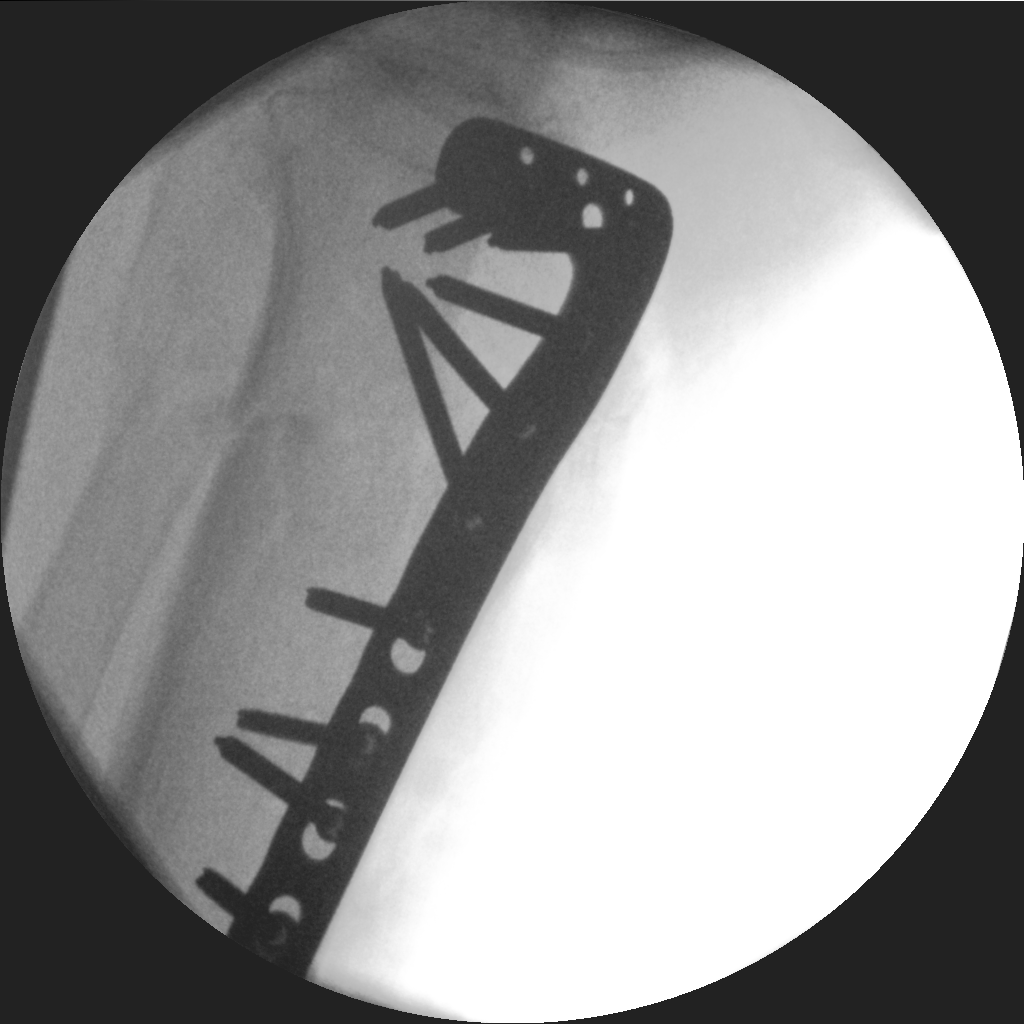

[2 of 2 positions shown; findings below may reference images not displayed]

FINDINGS: Two intraoperative spot images demonstrate plate and screw fixation
across the proximal left tibial fracture. Near anatomic alignment.
No hardware or bony complicating feature.
IMPRESSION: Internal fixation as above.

:

## 2018-07-20 ENCOUNTER — Other Ambulatory Visit: Payer: Self-pay | Admitting: Family Medicine

## 2018-07-23 ENCOUNTER — Other Ambulatory Visit: Payer: Self-pay | Admitting: Family Medicine

## 2018-07-23 DIAGNOSIS — R609 Edema, unspecified: Secondary | ICD-10-CM

## 2018-07-25 ENCOUNTER — Encounter: Payer: Medicare Other | Attending: Internal Medicine | Admitting: Internal Medicine

## 2018-07-25 DIAGNOSIS — L97221 Non-pressure chronic ulcer of left calf limited to breakdown of skin: Secondary | ICD-10-CM | POA: Insufficient documentation

## 2018-07-25 DIAGNOSIS — L03115 Cellulitis of right lower limb: Secondary | ICD-10-CM | POA: Diagnosis not present

## 2018-07-25 DIAGNOSIS — L03116 Cellulitis of left lower limb: Secondary | ICD-10-CM | POA: Insufficient documentation

## 2018-07-25 DIAGNOSIS — Z96651 Presence of right artificial knee joint: Secondary | ICD-10-CM | POA: Diagnosis not present

## 2018-07-25 DIAGNOSIS — Z809 Family history of malignant neoplasm, unspecified: Secondary | ICD-10-CM | POA: Insufficient documentation

## 2018-07-25 DIAGNOSIS — I10 Essential (primary) hypertension: Secondary | ICD-10-CM | POA: Diagnosis not present

## 2018-07-25 DIAGNOSIS — Z87891 Personal history of nicotine dependence: Secondary | ICD-10-CM | POA: Insufficient documentation

## 2018-07-25 DIAGNOSIS — L97211 Non-pressure chronic ulcer of right calf limited to breakdown of skin: Secondary | ICD-10-CM | POA: Insufficient documentation

## 2018-07-25 DIAGNOSIS — I87333 Chronic venous hypertension (idiopathic) with ulcer and inflammation of bilateral lower extremity: Secondary | ICD-10-CM | POA: Diagnosis not present

## 2018-07-25 DIAGNOSIS — E11622 Type 2 diabetes mellitus with other skin ulcer: Secondary | ICD-10-CM | POA: Insufficient documentation

## 2018-07-26 NOTE — Progress Notes (Signed)
Sierra [name], Sierra [name] ([id number]) Visit Report for 07/25/2018 Abuse/Suicide Risk Screen Details Patient Name: Sierra [name], Sierra [name]. Date of Service: 07/25/2018 1:00 PM Medical Record Number: [id number] Patient Account Number: [id number] Date of Birth/Sex: 12-Jan-1941 (77 y.o. F) Treating RN: Curtis Sites Primary Care Tryston Gilliam: Lucy Chris Other Clinician: Referring Hanif Radin: Lucy Chris Treating Elodie Panameno/Extender: Altamese [place] in Treatment: 0 Abuse/Suicide Risk Screen Items Answer ABUSE/SUICIDE RISK SCREEN: Has anyone close to you tried to hurt or harm you recentlyo No Do you feel uncomfortable with anyone in your familyo No Has anyone forced you do things that you didnot want to doo No Do you have any thoughts of harming yourselfo No Patient displays signs or symptoms of abuse and/or neglect. No Electronic Signature(s) Signed: 07/25/2018 5:19:25 PM By: Curtis Sites Entered By: Curtis Sites on 07/25/2018 13:18:42 Sierra [name] ([id number]) -------------------------------------------------------------------------------- Activities of Daily Living Details Patient Name: Sierra [name]. Date of Service: 07/25/2018 1:00 PM Medical Record Number: [id number] Patient Account Number: [id number] Date of Birth/Sex: 04-May-1941 (77 y.o. F) Treating RN: Curtis Sites Primary Care Nawal Burling: Lucy Chris Other Clinician: Referring Kimberle Stanfill: Lucy Chris Treating Yuritzy Zehring/Extender: Altamese [place] in Treatment: 0 Activities of Daily Living Items Answer Activities of Daily Living (Please select one for each item) Drive Automobile Not Able Take Medications Completely Able Use Telephone Completely Able Care for Appearance Completely Able Use Toilet Completely Able Bath / Shower Completely Able Dress Self Completely Able Feed Self Completely Able Walk Completely Able Get In / Out Bed Completely Able Housework Need Assistance Prepare  Meals Need Assistance Handle Money Completely Able Shop for Self Need Assistance Electronic Signature(s) Signed: 07/25/2018 5:19:25 PM By: Curtis Sites Entered By: Curtis Sites on 07/25/2018 13:19:11 Sierra [name] ([id number]) -------------------------------------------------------------------------------- Education Assessment Details Patient Name: Sierra [name]. Date of Service: 07/25/2018 1:00 PM Medical Record Number: [id number] Patient Account Number: [id number] Date of Birth/Sex: August 19, 1940 (77 y.o. F) Treating RN: Curtis Sites Primary Care Shreyas Piatkowski: Lucy Chris Other Clinician: Referring Wilhelmena Zea: Lucy Chris Treating Mailin Coglianese/Extender: Altamese [place] in Treatment: 0 Primary Learner Assessed: Patient Learning Preferences/Education Level/Primary Language Learning Preference: Explanation, Demonstration Highest Education Level: College or Above Preferred Language: English Cognitive Barrier Assessment/Beliefs Language Barrier: No Translator Needed: No Memory Deficit: No Emotional Barrier: No Cultural/Religious Beliefs Affecting Medical Care: No Physical Barrier Assessment Impaired Vision: No Impaired Hearing: No Decreased Hand dexterity: No Knowledge/Comprehension Assessment Knowledge Level: Medium Comprehension Level: Medium Ability to understand written Medium instructions: Ability to understand verbal Medium instructions: Motivation Assessment Anxiety Level: Calm Cooperation: Cooperative Education Importance: Acknowledges Need Interest in Health Problems: Asks Questions Perception: Coherent Willingness to Engage in Self- Medium Management Activities: Readiness to Engage in Self- Medium Management Activities: Electronic Signature(s) Signed: 07/25/2018 5:19:25 PM By: Curtis Sites Entered By: Curtis Sites on 07/25/2018 13:19:33 Sierra [name]  ([id number]) -------------------------------------------------------------------------------- Fall Risk Assessment Details Patient Name: Sierra [name]. Date of Service: 07/25/2018 1:00 PM Medical Record Number: [id number] Patient Account Number: [id number] Date of Birth/Sex: [birth year] (77 y.o. F) Treating RN: Curtis Sites Primary Care Tobie Hellen: Lucy Chris Other Clinician: Referring [place] Name: Lucy Chris Treating Jennafer Gladue/Extender: Altamese  in Treatment: 0 Fall Risk Assessment Items Have you had 2 or more falls in the last 12 monthso 0 No Have you had any fall that resulted in injury in the last 12 monthso 0 No FALL RISK ASSESSMENT: History of falling - immediate or within 3 months 0 No Secondary diagnosis 0 No Ambulatory aid None/bed rest/wheelchair/nurse 0 No Crutches/cane/walker 15 Yes Furniture  0 No IV Access/Saline Lock 0 No Gait/Training Normal/bed rest/immobile 0 No Weak 10 Yes Impaired 20 Yes Mental Status Oriented to own ability 0 Yes Electronic Signature(s) Signed: 07/25/2018 5:19:25 PM By: Curtis Sitesorthy, Joanna Entered By: Curtis Sitesorthy, Joanna on 07/25/2018 13:19:46 Sierra [name], [name] ([id number]) -------------------------------------------------------------------------------- Foot Assessment Details Patient Name: Sierra [name], [name] Date of Service: 07/25/2018 1:00 PM Medical Record Number: [id number] Patient Account Number: [id number] Date of Birth/Sex: 22-Sep-1940 (77 y.o. F) Treating RN: Curtis Sitesorthy, Joanna Primary Care Nuala Chiles: [name], [name] Other Clinician: Referring Davaris Youtsey: [name], [name] Treating Zanasia Hickson/Extender: [name], [name] We[place] in Treatment: 0 Foot Assessment Items Site Locations + = Sensation present, - = Sensation absent, C = Callus, U = Ulcer R = Redness, W = Warmth, M = Maceration, PU = Pre-ulcerative lesion F = Fissure, S = Swelling, D = Dryness Assessment Right: Left: Other Deformity: No No Prior Foot Ulcer:  No No Prior Amputation: No No Charcot Joint: No No Ambulatory Status: Ambulatory With Help Assistance Device: Walker Gait: Surveyor, miningUnsteady Electronic Signature(s) Signed: 07/25/2018 5:19:25 PM By: Curtis Sitesorthy, Joanna Entered By: Curtis Sitesorthy, Joanna on 07/25/2018 13:31:11 Sierra [name], [name] ([id number]) -------------------------------------------------------------------------------- Nutrition Risk Assessment Details Patient Name: Sierra [name], [name] Date of Service: 07/25/2018 1:00 PM Medical Record Number: [id number] Patient Account Number: [id number] Date of Birth/Sex: 22-Sep-1940 (77 y.o. F) Treating RN: Curtis Sitesorthy, Joanna Primary Care Talin Feister: [name], [name] Other Clinician: Referring Erik Nessel: [name], [name] Treating [place]/Extender: [name], [name] [place] in Treatment: 0 Height (in): Weight (lbs): Body Mass Index (BMI): Nutrition Risk Assessment Items NUTRITION RISK SCREEN: I have an illness or condition that made me change the kind and/or amount of 0 No food I eat I eat fewer than two meals per day 0 No I eat few fruits and vegetables, or milk products 0 No I have three or more drinks of beer, liquor or wine almost every day 0 No I have tooth or mouth problems that make it hard for me to eat 0 No I don't always have enough money to buy the food I need 0 No I eat alone most of the time 0 No I take three or more different prescribed or over-the-counter drugs a day 1 Yes Without wanting to, I have lost or gained 10 pounds in the last six months 0 No I am not always physically able to shop, cook and/or feed myself 0 No Nutrition Protocols Good Risk Protocol 0 No interventions needed Moderate Risk Protocol Electronic Signature(s) Signed: 07/25/2018 5:19:25 PM By: Curtis Sitesorthy, Joanna Entered By: Curtis Sitesorthy, Joanna on 07/25/2018 13:19:52

## 2018-07-26 NOTE — Progress Notes (Signed)
Sierra [name], [name] ([id number]) Visit Report for 07/25/2018 Allergy List Details Patient Name: Sierra [name], [name] Date of Service: 07/25/2018 1:00 PM Medical Record Number: [id number] Patient Account Number: [id number] Date of Birth/Sex: Feb 01, 1941 (77 y.o. F) Treating RN: Sierra [name], Sierra Primary Care Sierra [name]: Sierra [name], Sierra Other Clinician: Referring Sierra [name]: Sierra [name], Sierra Treating Dail Lerew/Extender: Sierra [name], Sierra G [place] in Treatment: 0 Allergies Active Allergies No Known Drug Allergies Allergy Notes Electronic Signature(s) Signed: 07/25/2018 5:19:25 PM By: Sierra [name], Sierra Entered By: Sierra [name], Sierra on 07/25/2018 13:18:33 Sierra [name], [name] ([id number]) -------------------------------------------------------------------------------- Arrival Information Details Patient Name: Sierra [name], [name] Date of Service: 07/25/2018 1:00 PM Medical Record Number: [id number] Patient Account Number: [id number] Date of Birth/Sex: Feb 01, 1941 (77 y.o. F) Treating RN: Sierra [name], Sierra Primary Care Sacha Radloff: Sierra [name], Sierra Other Clinician: Referring [name]: Sierra [name], Sierra Treating Sierra [name], Sierra G [place] in Treatment: 0 Visit Information Patient Arrived: Wheel Chair Arrival Time: 13:13 Accompanied By: self Transfer Assistance: None Patient Identification Verified: Yes Secondary Verification Process Yes Completed: Patient Has Alerts: Yes Patient Alerts: ABI [place] BILATERAL >220 Electronic Signature(s) Signed: 07/25/2018 1:46:42 PM By: Sierra [name], Sierra Entered By: Sierra [name], Sierra on 07/25/2018 13:46:42 Sierra [name], [name] ([id number]) -------------------------------------------------------------------------------- Clinic Level of Care Assessment Details Patient Name: Sierra [name], [name] Date of Service: 07/25/2018 1:00 PM Medical Record Number: [id number] Patient Account Number: [id number] Date of Birth/Sex: Feb 01, 1941 (77 y.o. F) Treating RN: Sierra [name],  Sierra Primary Care Sierra [name]: Sierra [name], Sierra Other Clinician: Referring Sierra [name]: Sierra [name], Sierra Treating Praneeth Bussey/Extender: Sierra [name], Sierra G [place] in Treatment: 0 Clinic Level of Care Assessment Items TOOL 2 Quantity Score []  - Use when only an EandM is performed on the INITIAL visit 0 ASSESSMENTS - Nursing Assessment / Reassessment X - General Physical Exam (combine w/ comprehensive assessment (listed just below) when 1 20 performed on new pt. evals) X- 1 25 Comprehensive Assessment (HX, ROS, Risk Assessments, Wounds Hx, etc.) ASSESSMENTS - Wound and Skin Assessment / Reassessment X - Simple Wound Assessment / Reassessment - one wound 1 5 []  - 0 Complex Wound Assessment / Reassessment - multiple wounds []  - 0 Dermatologic / Skin Assessment (not related to wound area) ASSESSMENTS - Ostomy and/or Continence Assessment and Care []  - Incontinence Assessment and Management 0 []  - 0 Ostomy Care Assessment and Management (repouching, etc.) PROCESS - Coordination of Care X - Simple Patient / Family Education for ongoing care 1 15 []  - 0 Complex (extensive) Patient / Family Education for ongoing care X- 1 10 Staff obtains ChiropractorConsents, Records, Test Results / Process Orders []  - 0 Staff telephones HHA, Nursing Homes / Clarify orders / etc []  - 0 Routine Transfer to another Facility (non-emergent condition) []  - 0 Routine Hospital Admission (non-emergent condition) X- 1 15 New Admissions / Manufacturing engineernsurance Authorizations / Ordering NPWT, Apligraf, etc. []  - 0 Emergency Hospital Admission (emergent condition) X- 1 10 Simple Discharge Coordination []  - 0 Complex (extensive) Discharge Coordination PROCESS - Special Needs []  - Pediatric / Minor Patient Management 0 []  - 0 Isolation Patient Management Sierra [name], [name] ([id number]) []  - 0 Hearing / Language / Visual special needs []  - 0 Assessment of Community assistance (transportation, D/C planning, etc.) []  - 0 Additional  assistance / Altered mentation []  - 0 Support Surface(s) Assessment (bed, cushion, seat, etc.) INTERVENTIONS - Wound Cleansing / Measurement X - Wound Imaging (photographs - any number of wounds) 1 5 []  - 0 Wound Tracing (instead of photographs) X- 1 5 Simple Wound Measurement - one wound []  - 0 Complex Wound Measurement - multiple wounds X- 1  5 Simple Wound Cleansing - one wound []  - 0 Complex Wound Cleansing - multiple wounds INTERVENTIONS - Wound Dressings []  - Small Wound Dressing one or multiple wounds 0 []  - 0 Medium Wound Dressing one or multiple wounds X- 1 20 Large Wound Dressing one or multiple wounds []  - 0 Application of Medications - injection INTERVENTIONS - Miscellaneous []  - External ear exam 0 []  - 0 Specimen Collection (cultures, biopsies, blood, body fluids, etc.) []  - 0 Specimen(s) / Culture(s) sent or taken to Lab for analysis []  - 0 Patient Transfer (multiple staff / Nurse, adult / Similar devices) []  - 0 Simple Staple / Suture removal (25 or less) []  - 0 Complex Staple / Suture removal (26 or more) []  - 0 Hypo / Hyperglycemic Management (close monitor of Blood Glucose) X- 1 15 Ankle / Brachial Index (ABI) - do not check if billed separately Has the patient been seen at the hospital within the last three years: Yes Total Score: 150 Level Of Care: New/Established - Level 4 Electronic Signature(s) Signed: 07/25/2018 6:00:27 PM By: Sierra [name], BSN, RN, CWS, Kim RN, BSN Entered By: Sierra [name], BSN, RN, CWS, Sierra on 07/25/2018 14:12:38 Sierra [name] ([id number]) -------------------------------------------------------------------------------- Lower Extremity Assessment Details Patient Name: Sierra [name]. Date of Service: 07/25/2018 1:00 PM Medical Record Number: [id number] Patient Account Number: [id number] Date of Birth/Sex: [birth year] (77 y.o. F) Treating RN: Sierra Sites Primary Care Patti Shorb: Sierra Chris Other Clinician: Referring  Louie Meaders: Sierra Chris Treating Cooper Stamp/Extender: Sierra [place] in Treatment: 0 Edema Assessment Assessed: [Left: No] [Right: No] Edema: [Left: Yes] [Right: Yes] Calf Left: Right: Point of Measurement: 32 cm From Medial Instep 46.2 cm 47.1 cm Ankle Left: Right: Point of Measurement: 12 cm From Medial Instep 26.8 cm 27.5 cm Vascular Assessment Pulses: Dorsalis Pedis Palpable: [Left:Yes] [Right:Yes] Doppler Audible: [Left:Yes] [Right:Yes] Posterior Tibial Palpable: [Left:No] [Right:No] Doppler Audible: [Left:Yes] [Right:Yes] Extremity colors, hair growth, and conditions: Extremity Color: [Left:Red] [Right:Hyperpigmented] Hair Growth on Extremity: [Left:No] [Right:No] Temperature of Extremity: [Left:Cool] [Right:Cool] Capillary Refill: [Left:> 3 seconds] [Right:> 3 seconds] Toe Nail Assessment Left: Right: Thick: Yes Yes Discolored: Yes Yes Deformed: No No Improper Length and Hygiene: Yes Yes Notes ABI [place] BILATERAL >220 Electronic Signature(s) Signed: 07/25/2018 1:46:59 PM By: Sierra Sites Entered By: Sierra Sites on 07/25/2018 13:46:59 Sierra [name] ([id number]) -------------------------------------------------------------------------------- Multi Wound Chart Details Patient Name: Sierra [name]. Date of Service: 07/25/2018 1:00 PM Medical Record Number: [id number] Patient Account Number: [id number] Date of Birth/Sex: September 04, 1940 (77 y.o. F) Treating RN: Sierra Coventry Primary Care Nayelie Gionfriddo: Sierra Chris Other Clinician: Referring Jaquavious Mercer: Sierra Chris Treating Cotina Freedman/Extender: Sierra [place] in Treatment: 0 Vital Signs Height(in): Pulse(bpm): 105 Weight(lbs): Blood Pressure(mmHg): 161/76 Body Mass Index(BMI): Temperature(F): 97.6 Respiratory Rate 18 (breaths/min): Wound Assessments Treatment Notes Electronic Signature(s) Signed: 07/25/2018 6:00:27 PM By: Sierra [name], BSN, RN, CWS, Kim RN, BSN Entered By: Sierra [name], BSN,  RN, CWS, Sierra on 07/25/2018 14:04:49 Sierra [name] ([id number]) -------------------------------------------------------------------------------- Multi-Disciplinary Care Plan Details Patient Name: Sierra [name], BRIGGS. Date of Service: 07/25/2018 1:00 PM Medical Record Number: [id number] Patient Account Number: [id number] Date of Birth/Sex: Mar 16, 1941 (77 y.o. F) Treating RN: Sierra Coventry Primary Care Tyree Fluharty: Sierra Chris Other Clinician: Referring Daphine Loch: Sierra Chris Treating Timaya Bojarski/Extender: Sierra [place] in Treatment: 0 Active Inactive Orientation to the Wound Care Program Nursing Diagnoses: Knowledge deficit related to the wound healing center program Goals: Patient/caregiver will verbalize understanding of the Wound Healing Center Program Date Initiated: 07/25/2018 Target Resolution Date: 08/25/2018 Goal Status: Active Interventions:  Provide education on orientation to the wound center Notes: Soft Tissue Infection Nursing Diagnoses: Impaired tissue integrity Goals: Patient's soft tissue infection will resolve Date Initiated: 07/25/2018 Target Resolution Date: 08/25/2018 Goal Status: Active Interventions: Assess signs and symptoms of infection every visit Treatment Activities: Systemic antibiotics : 07/25/2018 Notes: Venous Leg Ulcer Nursing Diagnoses: Potential for venous Insuffiency (use before diagnosis confirmed) Goals: Patient will maintain optimal edema control Date Initiated: 07/25/2018 Target Resolution Date: 08/25/2018 Goal Status: Active Sierra [name], SOUSA. ([id number]) Interventions: Assess peripheral edema status every visit. Provide education on venous insufficiency Notes: Wound/Skin Impairment Nursing Diagnoses: Impaired tissue integrity Knowledge deficit related to smoking impact on wound healing Goals: Ulcer/skin breakdown will have a volume reduction of 30% by week 4 Date Initiated: 07/25/2018 Target Resolution Date:  08/25/2018 Goal Status: Active Interventions: Assess ulceration(s) every visit Treatment Activities: Topical wound management initiated : 07/25/2018 Notes: Electronic Signature(s) Signed: 07/25/2018 6:00:27 PM By: Sierra [name], BSN, RN, CWS, Kim RN, BSN Entered By: Sierra [name], BSN, RN, CWS, Sierra on 07/25/2018 13:57:20 Sierra [name] ([id number]) -------------------------------------------------------------------------------- Non-Wound Condition Assessment Details Patient Name: Sierra [name]. Date of Service: 07/25/2018 1:00 PM Medical Record Number: [id number] Patient Account Number: [id number] Date of Birth/Sex: 1941/02/27 (77 y.o. F) Treating RN: Sierra Sites Primary Care Orella Cushman: Sierra Chris Other Clinician: Referring Azavier Creson: Sierra Chris Treating Lisha Vitale/Extender: Sierra Caul [place] in Treatment: 0 Non-Wound Condition: Condition: Lymphedema Location: Leg Side: Bilateral Photos Periwound Skin Texture Texture Color No Abnormalities Noted: No No Abnormalities Noted: No Callus: No Atrophie Blanche: No Crepitus: No Cyanosis: No Excoriation: No Ecchymosis: No Friable: No Erythema: Yes Induration: No Hemosiderin Staining: Yes Rash: No Mottled: No Scarring: No Pallor: No Rubor: No Moisture No Abnormalities Noted: No Temperature / Pain Dry / Scaly: No Temperature: No Abnormality Maceration: No Tenderness on Palpation: Yes Electronic Signature(s) Signed: 07/25/2018 1:56:36 PM By: Sierra Sites Entered By: Sierra Sites on 07/25/2018 13:56:35 Sierra [name] ([id number]) -------------------------------------------------------------------------------- Pain Assessment Details Patient Name: Sierra [name]. Date of Service: 07/25/2018 1:00 PM Medical Record Number: [id number] Patient Account Number: [id number] Date of Birth/Sex: 1940/07/30 (77 y.o. F) Treating RN: Sierra Coventry Primary Care Reis Goga: Sierra Chris Other  Clinician: Referring Shterna Laramee: Sierra Chris Treating Jamani Bearce/Extender: Sierra [place] in Treatment: 0 Active Problems Location of Pain Severity and Description of Pain Patient Has Paino No Site Locations Pain Management and Medication Current Pain Management: Electronic Signature(s) Signed: 07/25/2018 3:53:44 PM By: Dayton Martes RCP, RRT, CHT Signed: 07/25/2018 6:00:27 PM By: Sierra [name], BSN, RN, CWS, Kim RN, BSN Entered By: Dayton Martes on 07/25/2018 13:14:31 Sierra [name] ([id number]) -------------------------------------------------------------------------------- Patient/Caregiver Education Details Patient Name: Sierra [name]. Date of Service: 07/25/2018 1:00 PM Medical Record Number: [id number] Patient Account Number: [id number] Date of Birth/Gender: Oct 05, 1940 (77 y.o. F) Treating RN: Sierra Coventry Primary Care Physician: Sierra Chris Other Clinician: Referring Physician: Lucy Chris Treating Physician/Extender: Sierra [place] in Treatment: 0 Education Assessment Education Provided To: Patient Education Topics Provided Venous: Handouts: Controlling Swelling with Multilayered Compression Wraps Methods: Demonstration, Explain/Verbal Responses: State content correctly Wound/Skin Impairment: Handouts: Caring for Your Ulcer Methods: Demonstration Responses: State content correctly Electronic Signature(s) Signed: 07/25/2018 6:00:27 PM By: Sierra [name], BSN, RN, CWS, Kim RN, BSN Entered By: Sierra [name], BSN, RN, CWS, Sierra on 07/25/2018 14:13:06 Sierra [name] ([id number]) -------------------------------------------------------------------------------- Vitals Details Patient Name: Sierra [name]. Date of Service: 07/25/2018 1:00 PM Medical Record Number: [id number] Patient Account Number: [id number] Date of Birth/Sex: [birth year] (77 y.o. F) Treating RN: Sierra Coventry Primary Care Sharis Keeran: Ivory Broad,  Sierra Other  Clinician: Referring Deagan Sevin: Sierra [name], Sierra Treating Zaccai Chavarin/Extender: Sierra [name], Sierra G Weeks in Treatment: 0 Vital Signs Time Taken: 13:14 Temperature (F): 97.6 Pulse (bpm): 105 Respiratory Rate (breaths/min): 18 Blood Pressure (mmHg): 161/76 Reference Range: 80 - 120 mg / dl Airway Electronic Signature(s) Signed: 07/25/2018 3:53:44 PM By: Dayton MartesWallace, RCP,RRT,CHT, Sallie RCP, RRT, CHT Entered By: Dayton MartesWallace, RCP,RRT,CHT, Sallie on 07/25/2018 13:15:01

## 2018-07-27 NOTE — Progress Notes (Signed)
Sierra [name], [name] ([id number]) Visit Report for 07/25/2018 HPI Details Patient Name: Sierra [name], [name] Date of Service: 07/25/2018 1:00 PM Medical Record Number: [id number] Patient Account Number: [id number] Date of Birth/Sex: 09-23-1940 (77 y.o. F) Treating RN: Huel CoventryWoody, Kim Primary Care Provider: [name], [name] Other Clinician: Referring Provider: [name], [name] Treating Provider/Extender: [name], [name] Weeks in Treatment: 0 History of Present Illness HPI Description: Admission 07/25/2018 Patient is a 78 year old woman who [place]. She was referred here after recently establishing primary care. I get the sense that she has been ignoring medical care for a number of years. Her appointment was sometime last week. It was notable that she had erythema and tenderness predominantly in her left posterior greater than right posterior calf and an antibiotic was prescribed although she still has not picked this up. She plans to do this later today. We do not have records from her primary doctor and I do not know exactly what antibiotic. The history she provides is extremely vague. She talks about increased swelling and erythema in her legs in the last several weeks to 2 months although I suspect it is been there longer than that. She walks with a walker. The patient has weeping areas in the right posterior calf but the most impressive area is a large area in the left posterior calf. 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 She is complaining of pain in her posterior calves bilaterally. Past medical history; there is nothing on her in [place] link. 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 The patient has lost some weight but she states she has been trying to lose some weight. She has quit smoking in 1994. There is no records of her being a diabetic ABIs were noncompressible bilaterally Electronic SignatureFriedmans) Signed: 07/26/2018 9:49:40 AM By: Baltazar Najjarobson, Michael MD Entered By: Baltazar Najjarobson, Michael on 07/25/2018 14:22:43 Sierra [name], [name] ([id number]) -------------------------------------------------------------------------------- Physical Exam Details Patient Name: Sierra [name], [name] Date of Service: 07/25/2018 1:00 PM Medical Record Number: [id number] Patient Account Number: [id number] Date of Birth/Sex: 09-23-1940 (77 y.o. F) Treating RN: Huel CoventryWoody, Kim Primary Care Provider: [name], [name] Other Clinician: Referring Provider: [name], [name] Treating Provider/Extender: [name], [name] Weeks in Treatment: 0 Constitutional Patient is hypertensive.. Pulse regular and within target range for patient.Marland Kitchen. Respirations regular, non-labored and within target range.. Temperature is normal and within the target range for the patient.. Patient looks older than her stated age.. Eyes Conjunctivae clear. No discharge. Ears, Nose, Mouth, and Throat Has dentures that do not fit properly. Wearing a wig. Respiratory Respiratory effort is easy and symmetric bilaterally. Rate is normal at rest and on room air.. Shallow air entry with mild expiratory wheezing. Cardiovascular Femoral arteries without bruits and pulses strong.. Pedal pulses palpable and strong bilaterally.. Edema present in both extremities. Left much more than right. This is nonpitting. Significant left greater than right erythema. Lymphatic None palpable in the popliteal or inguinal area, none palpable in the cervical area bilaterally. Psychiatric No evidence of depression, anxiety, or agitation. Calm, cooperative, and communicative. Appropriate  interactions and affect.. Notes Wound exam; the area questions on the posterior left greater than posterior right calf. Complete loss of epidermal integrity but no deep  wound. There is very significant erythema and weeping edema fluid. Electronic SignatureFriedmans) Signed: 07/26/2018 9:49:40 AM By: Baltazar Najjar MD Entered By: Baltazar Najjar on 07/25/2018 14:35:47 Sierra [name]) -------------------------------------------------------------------------------- Physician Orders Details Patient Name: Sierra Chancy. Date of Service: 07/25/2018 1:00 PM Medical Record Number: [id number] Patient Account Number: [id number] Date of Birth/Sex: [birth year] (77 y.o. F) Treating RN: Huel Coventry Primary Care Provider: Lucy Chris Other Clinician: Referring Provider: Lucy Chris Treating Provider/Extender: Altamese [place] in Treatment: 0 Verbal / Phone Orders: No Diagnosis Coding Wound Cleansing o Cleanse wound with mild soap and water Anesthetic (add to Medication List) o Topical Lidocaine 4% cream applied to wound bed prior to debridement (In Clinic Only). Primary Wound Dressing o Silver Alginate - on draining areas-bilateral lower extremities Secondary Dressing o XtraSorb Dressing Change Frequency o Change Dressing Monday, Wednesday, Friday Follow-up Appointments o Return Appointment in 1 week. Edema Control o Kerlix and Coban - Bilateral Home Health o Initiate Home Health for Skilled Nursing o Home Health Nurse may visit PRN to address patientos wound care needs. o FACE TO FACE ENCOUNTER: MEDICARE and MEDICAID PATIENTS: I certify that this patient is under my care and that I had a face-to-face encounter that meets the physician face-to-face encounter requirements with this patient on this date. The encounter with the patient was in whole or in part for the following MEDICAL CONDITION: (primary reason for Home Healthcare) MEDICAL  NECESSITY: I certify, that based on my findings, NURSING services are a medically necessary home health service. HOME BOUND STATUS: I certify that my clinical findings support that this patient is homebound (i.e., Due to illness or injury, pt requires aid of supportive devices such as crutches, cane, wheelchairs, walkers, the use of special transportation or the assistance of another person to leave their place of residence. There is a normal inability to leave the home and doing so requires considerable and taxing effort. Other absences are for medical reasons / religious services and are infrequent or of short duration when for other reasons). o If current dressing causes regression in wound condition, may D/C ordered dressing product/s and apply Normal Saline Moist Dressing daily until next Wound Healing Center / Other MD appointment. Notify Wound Healing Center of regression in wound condition at [phone]. o Please direct any NON-WOUND related issues/requests for orders to patient's Primary Care Physician Medications-please add to medication list. o P.O. Antibiotics - continue until complete Services and Therapies o Arterial Studies- Bilateral Electronic SignatureFriedmans) Signed: 07/25/2018 6:00:27 PM By: Elliot Gurney, BSN, RN, CWS, Kim RN, BSN Taniqua, Pettinger [place] ([id number]) Signed: 07/26/2018 9:49:40 AM By: Baltazar Najjar MD Entered By: Elliot Gurney, BSN, RN, CWS, Kim on 07/25/2018 15:13:47 KAMIRAH, HAEFELE ([id number]) -------------------------------------------------------------------------------- Problem List Details Patient Name: CAMIE, KNABLE. Date of Service: 07/25/2018 1:00 PM Medical Record Number: [id number] Patient Account Number: [id number] Date of Birth/Sex: 1940-09-22 (77 y.o. 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 ([id number]) -------------------------------------------------------------------------------- Progress Note Details Patient Name: Sierra [name], [name] Date of Service: 07/25/2018 1:00 PM Medical Record Number: [id number] Patient Account Number: [id number] Date of Birth/Sex: 11/08/1940 (77 y.o. F) Treating RN: Huel CoventryWoody, Kim Primary Care Provider: [name], [name] Other Clinician: Referring Provider: [name], [name] Treating Provider/Extender: [name], [name] Weeks in Treatment: 0 Subjective History of Present Illness (HPI) Admission 07/25/2018 Patient is a 78 year old woman who [place] alone [place]. She was referred here after recently establishing primary care. I get the sense that she has been ignoring medical care for a number of years. Her appointment was sometime last week. It was notable that she had erythema and tenderness predominantly in her left posterior greater than right posterior calf and an antibiotic was prescribed although she still has not picked this up. She plans to do this later today. We do not have records from her primary doctor and I do not know exactly what antibiotic. The history she provides is extremely vague. She talks about increased swelling and erythema in her legs in the last several weeks to 2  months although I suspect it is been there longer than that. She walks with a walker. The patient has weeping areas in the right posterior calf but the most impressive area is a large area in the left posterior calf. 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 She is complaining of pain in her posterior calves bilaterally. Past medical history; there is nothing on her in [place] link. 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 The patient has lost some weight but she states she has been trying to lose some weight. She has quit smoking in 1994. There is no records of her being a diabetic ABIs were noncompressible bilaterally Wound History Patient reportedly has not tested positive for osteomyelitis. Patient reportedly has not had testing performed to evaluate circulation in the legs. Patient experiences the following problems associated with their wounds: swelling. Patient History Information obtained from Patient. Allergies No Known Drug Allergies Family History Cancer - Mother, Stroke - Maternal Grandparents, No family history of Diabetes, Heart Disease, Hereditary Spherocytosis, Hypertension, Kidney Disease, Lung Disease, Seizures, Thyroid Problems, Tuberculosis. Social History Former smoker - 1994, Marital Status - Widowed, Alcohol Use - Moderate, Drug Use - No History, Caffeine Use - Moderate. Medical History Eyes Denies history of Cataracts, Glaucoma, Optic Neuritis Sierra [name], [name] ([id number]) Ear/Nose/Mouth/Throat Denies history of Chronic sinus problems/congestion, Middle ear problems Hematologic/Lymphatic Patient has history of  Anemia Denies history of Hemophilia, Human Immunodeficiency Virus, Lymphedema, Sickle Cell Disease Respiratory Denies history of Aspiration, Asthma, Chronic Obstructive Pulmonary Disease (COPD), Pneumothorax, Sleep Apnea, Tuberculosis Cardiovascular Denies history of Angina, Arrhythmia, Congestive Heart Failure, Coronary Artery Disease, Deep Vein Thrombosis, Hypertension, Hypotension, Myocardial Infarction, Peripheral Arterial Disease, Peripheral Venous Disease, Phlebitis, Vasculitis Gastrointestinal Denies history of Cirrhosis , Colitis, Crohn s, Hepatitis A, Hepatitis B, Hepatitis C Endocrine Denies history of Type I Diabetes, Type II Diabetes Genitourinary Denies history of End Stage Renal Disease Immunological Denies history of Lupus Erythematosus, Raynaud s, Scleroderma Integumentary (Skin) Denies history of History of Burn, History of pressure wounds Musculoskeletal Denies history of Gout, Rheumatoid Arthritis, Osteoarthritis, Osteomyelitis  Neurologic Denies history of Dementia, Neuropathy, Quadriplegia, Paraplegia, Seizure Disorder Oncologic Denies history of Received Chemotherapy, Received Radiation Psychiatric Denies history of Anorexia/bulimia, Confinement Anxiety Review of Systems (ROS) Constitutional Symptoms (General Health) Denies complaints or symptoms of Fatigue, Fever, Chills, Marked Weight Change. Eyes Complains or has symptoms of Glasses / Contacts. Denies complaints or symptoms of Dry Eyes, Vision Changes. Ear/Nose/Mouth/Throat Denies complaints or symptoms of Difficult clearing ears, Sinusitis. Hematologic/Lymphatic Denies complaints or symptoms of Bleeding / Clotting Disorders, Human Immunodeficiency Virus. Respiratory Denies complaints or symptoms of Chronic or frequent coughs, Shortness of Breath. Cardiovascular Complains or has symptoms of LE edema. Denies complaints or symptoms of Chest pain. Gastrointestinal Denies complaints or symptoms of  Frequent diarrhea, Nausea, Vomiting. Endocrine Denies complaints or symptoms of Hepatitis, Thyroid disease, Polydypsia (Excessive Thirst). Genitourinary Denies complaints or symptoms of Kidney failure/ Dialysis, Incontinence/dribbling. Immunological Denies complaints or symptoms of Hives, Itching. Integumentary (Skin) Denies complaints or symptoms of Wounds, Bleeding or bruising tendency, Breakdown, Swelling. Musculoskeletal Denies complaints or symptoms of Muscle Pain, Muscle Weakness. Neurologic Denies complaints or symptoms of Numbness/parasthesias, Focal/Weakness. PENIEL, BIEL [place]. ([id number]) Psychiatric Denies complaints or symptoms of Anxiety, Claustrophobia. Objective Constitutional Patient is hypertensive.. Pulse regular and within target range for patient.Marland Kitchen Respirations regular, non-labored and within target range.. Temperature is normal and within the target range for the patient.. Patient looks older than her stated age.. Vitals Time Taken: 1:14 PM, Temperature: 97.6 F, Pulse: 105 bpm, Respiratory Rate: 18 breaths/min, Blood Pressure: 161/76 mmHg. Eyes Conjunctivae clear. No discharge. Ears, Nose, Mouth, and Throat Has dentures that do not fit properly. Wearing a wig. Respiratory Respiratory effort is easy and symmetric bilaterally. Rate is normal at rest and on room air.. Shallow air entry with mild expiratory wheezing. Cardiovascular Femoral arteries without bruits and pulses strong.. Pedal pulses palpable and strong bilaterally.. Edema present in both extremities. Left much more than right. This is nonpitting. Significant left greater than right erythema. Lymphatic None palpable in the popliteal or inguinal area, none palpable in the cervical area bilaterally. Psychiatric No evidence of depression, anxiety, or agitation. Calm, cooperative, and communicative. Appropriate interactions and affect.. General Notes: Wound exam; the area questions on the posterior  left greater than posterior right calf. Complete loss of epidermal integrity but no deep wound. There is very significant erythema and weeping edema fluid. 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 ([id number]) Plan Wound Cleansing: Cleanse wound with mild soap and water Anesthetic (add to Medication List): Topical Lidocaine 4% cream applied to wound bed prior to debridement (In Clinic Only). Skin Barriers/Peri-Wound Care: Triamcinolone Acetonide Ointment (TCA) Primary Wound Dressing: Silver Alginate - on draining areas Secondary Dressing: XtraSorb Dressing Change Frequency: Change Dressing Monday, Wednesday, Friday Follow-up Appointments: Return Appointment in 1 week. Edema Control: Kerlix and Coban - Left Lower Extremity Home Health: Initiate Home Health for Skilled Nursing Home Health Nurse may visit PRN to address patient s wound care needs. FACE TO FACE ENCOUNTER: MEDICARE and MEDICAID PATIENTS: I certify that this patient is under my care and that I had a face-to-face encounter that meets the physician face-to-face encounter requirements with this patient on this date. The encounter with the patient was in whole or in part for the following MEDICAL CONDITION: (primary reason for Home Healthcare) MEDICAL NECESSITY: I  certify, that based on my findings, NURSING services are a medically necessary home health service. HOME BOUND STATUS: I certify that my clinical findings support that this patient is homebound (i.e., Due to illness or injury, pt requires aid of supportive devices such as crutches, cane, wheelchairs, walkers, the use of special transportation or the assistance of another person to leave  their place of residence. There is a normal inability to leave the home and doing so requires considerable and taxing effort. Other absences are for medical reasons / religious services and are infrequent or of short duration when for other reasons). If current dressing causes regression in wound condition, may D/C ordered dressing product/s and apply Normal Saline Moist Dressing daily until next Wound Healing Center / Other MD appointment. Notify Wound Healing Center of regression in wound condition at [phone]. Please direct any NON-WOUND related issues/requests for orders to patient's Primary Care Physician Medications-please add to medication list.: P.O. Antibiotics Services and Therapies ordered were: Arterial Studies- Bilateral 1. I suspect this patient has a combination of lymphedema and chronic venous hypertension with stasis dermatitis. 2. I suspect most of the erythema in her left greater than right leg is related to stasis dermatitis rather than cellulitis although I do not disagree with the antibiotic. I still do not know what that is and after her primary care doctor prescribed at last week it is been almost a week before she is picking it up today. If this were truly cellulitis I think she would have been a lot more trouble especially in the left leg 3. Although I can feel her peripheral pulses her feet are cool and her capillary refill time is delayed. I am going to go ahead and order formal arterial studies. Venous reflux studies might be also beneficial at some time in the future 4. I suspect this patient has COPD. She did not have overt heart failure 5. To both areas on the posterior calfs we are going to apply silver alginate, extra sorb, ABDs and I put her in Kerlix and Coban compression bilaterally. We are going to try to get home health out to change the dressing once LEISHA, CHRYST ([id number]) Electronic SignatureFriedmans) Signed: 07/26/2018 9:49:40 AM By: Baltazar Najjar MD Entered By: Baltazar Najjar on 07/25/2018 14:52:33 Sierra [name]) -------------------------------------------------------------------------------- ROS/PFSH Details Patient Name: Sierra Chancy. Date of Service: 07/25/2018 1:00 PM Medical Record Number: [id number] Patient Account Number: [id number] Date of Birth/Sex: [birth year] (77 y.o. F) Treating RN: Curtis Sites Primary Care Provider: Lucy Chris Other Clinician: Referring Provider: Lucy Chris Treating Provider/Extender: Altamese [place] in Treatment: 0 Information Obtained From Patient Wound History Do you currently have one or more open woundso No Have you tested positive for osteomyelitis (bone infection)o No Have you had any tests for circulation on your legso No Have you had other problems associated with your woundso Swelling Constitutional Symptoms (General Health) Complaints and Symptoms: Negative for: Fatigue; Fever; Chills; Marked Weight Change Eyes Complaints and Symptoms: Positive for: Glasses / Contacts Negative for: Dry Eyes; Vision Changes Medical History: Negative for: Cataracts; Glaucoma; Optic Neuritis Ear/Nose/Mouth/Throat Complaints and Symptoms: Negative for: Difficult clearing ears; Sinusitis Medical History: Negative for: Chronic sinus problems/congestion; Middle ear problems Hematologic/Lymphatic Complaints and Symptoms: Negative for: Bleeding / Clotting Disorders; Human Immunodeficiency Virus Medical History: Positive for: Anemia Negative for: Hemophilia; Human Immunodeficiency Virus; Lymphedema; Sickle Cell Disease Respiratory Complaints and Symptoms: Negative for: Chronic or frequent coughs; Shortness of Breath Medical History: Negative for: Aspiration; Asthma; Chronic  Obstructive Pulmonary Disease (COPD); Pneumothorax; Sleep Apnea; Tuberculosis Cardiovascular LOVETA, DELLIS. ([id number]) Complaints and Symptoms: Positive for:  LE edema Negative for: Chest pain Medical History: Negative for: Angina; Arrhythmia; Congestive Heart Failure; Coronary Artery Disease; Deep Vein Thrombosis; Hypertension; Hypotension; Myocardial Infarction; Peripheral Arterial Disease; Peripheral Venous Disease; Phlebitis; Vasculitis Gastrointestinal Complaints and Symptoms: Negative for: Frequent diarrhea; Nausea; Vomiting Medical History: Negative for: Cirrhosis ; Colitis; Crohnos; Hepatitis A; Hepatitis B; Hepatitis C Endocrine Complaints and Symptoms: Negative for: Hepatitis; Thyroid disease; Polydypsia (Excessive Thirst) Medical History: Negative for: Type I Diabetes; Type II Diabetes Genitourinary Complaints and Symptoms: Negative for: Kidney failure/ Dialysis; Incontinence/dribbling Medical History: Negative for: End Stage Renal Disease Immunological Complaints and Symptoms: Negative for: Hives; Itching Medical History: Negative for: Lupus Erythematosus; Raynaudos; Scleroderma Integumentary (Skin) Complaints and Symptoms: Negative for: Wounds; Bleeding or bruising tendency; Breakdown; Swelling Medical History: Negative for: History of Burn; History of pressure wounds Musculoskeletal Complaints and Symptoms: Negative for: Muscle Pain; Muscle Weakness Medical History: Negative for: Gout; Rheumatoid Arthritis; Osteoarthritis; Osteomyelitis Neurologic BEXLEIGH, THERIAULT M. ([id number]) Complaints and Symptoms: Negative for: Numbness/parasthesias; Focal/Weakness Medical History: Negative for: Dementia; Neuropathy; Quadriplegia; Paraplegia; Seizure Disorder Psychiatric Complaints and Symptoms: Negative for: Anxiety; Claustrophobia Medical History: Negative for: Anorexia/bulimia; Confinement Anxiety Oncologic Medical History: Negative for: Received Chemotherapy; Received Radiation Immunizations Pneumococcal Vaccine: Received Pneumococcal Vaccination: Yes Immunization Notes: up to date Implantable  Devices Family and Social History Cancer: Yes - Mother; Diabetes: No; Heart Disease: No; Hereditary Spherocytosis: No; Hypertension: No; Kidney Disease: No; Lung Disease: No; Seizures: No; Stroke: Yes - Maternal Grandparents; Thyroid Problems: No; Tuberculosis: No; Former smoker - 1994; Marital Status - Widowed; Alcohol Use: Moderate; Drug Use: No History; Caffeine Use: Moderate; Financial Concerns: No; Food, Clothing or Shelter Needs: No; Support System Lacking: No; Transportation Concerns: No; Advanced Directives: No; Patient does not want information on Advanced Directives Electronic SignatureFriedmans) Signed: 07/25/2018 5:19:25 PM By: Curtis Sites Signed: 07/26/2018 9:49:40 AM By: Baltazar Najjar MD Entered By: Curtis Sites on 07/25/2018 13:30:35 Sierra [name]) -------------------------------------------------------------------------------- SuperBill Details Patient Name: Sierra Chancy. Date of Service: 07/25/2018 Medical Record Number: [id number] Patient Account Number: [id number] Date of Birth/Sex: Jul 29, 1940 (77 y.o. 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 I87.333 Chronic venous hypertension (idiopathic) with ulcer and inflammation of bilateral lower extremity L03.116 Cellulitis of left lower limb Facility Procedures CPT4 Code: [id number] Description: 99214 - WOUND CARE VISIT-LEV 4 EST PT Modifier: Quantity: 1 Physician Procedures CPT4: Description Modifier Quantity Code [id number] 99204 - WC PHYS LEVEL 4 - NEW PT 1 ICD-10 Diagnosis Description L97.221 Non-pressure chronic ulcer of left calf limited to breakdown  of skin L97.211 Non-pressure chronic ulcer of right calf limited to  breakdown of skin I87.333 Chronic venous hypertension (idiopathic) with ulcer and inflammation of bilateral lower extremity L03.116 Cellulitis of left lower limb Electronic SignatureFriedmans) Signed: 07/26/2018 9:49:40 AM By: Baltazar Najjar MD Entered By: Baltazar Najjar on 07/25/2018 14:53:23

## 2018-08-01 ENCOUNTER — Encounter: Payer: Medicare Other | Attending: Internal Medicine | Admitting: Internal Medicine

## 2018-08-01 DIAGNOSIS — L97221 Non-pressure chronic ulcer of left calf limited to breakdown of skin: Secondary | ICD-10-CM | POA: Insufficient documentation

## 2018-08-01 DIAGNOSIS — I890 Lymphedema, not elsewhere classified: Secondary | ICD-10-CM | POA: Insufficient documentation

## 2018-08-01 DIAGNOSIS — L03116 Cellulitis of left lower limb: Secondary | ICD-10-CM | POA: Diagnosis not present

## 2018-08-01 DIAGNOSIS — I87333 Chronic venous hypertension (idiopathic) with ulcer and inflammation of bilateral lower extremity: Secondary | ICD-10-CM | POA: Diagnosis not present

## 2018-08-01 DIAGNOSIS — L97211 Non-pressure chronic ulcer of right calf limited to breakdown of skin: Secondary | ICD-10-CM | POA: Insufficient documentation

## 2018-08-01 DIAGNOSIS — L97822 Non-pressure chronic ulcer of other part of left lower leg with fat layer exposed: Secondary | ICD-10-CM | POA: Diagnosis not present

## 2018-08-02 ENCOUNTER — Other Ambulatory Visit (INDEPENDENT_AMBULATORY_CARE_PROVIDER_SITE_OTHER): Payer: Self-pay | Admitting: Family Medicine

## 2018-08-02 ENCOUNTER — Other Ambulatory Visit (INDEPENDENT_AMBULATORY_CARE_PROVIDER_SITE_OTHER): Payer: Self-pay | Admitting: Internal Medicine

## 2018-08-02 DIAGNOSIS — I739 Peripheral vascular disease, unspecified: Secondary | ICD-10-CM

## 2018-08-02 DIAGNOSIS — M7989 Other specified soft tissue disorders: Secondary | ICD-10-CM

## 2018-08-02 DIAGNOSIS — S81809A Unspecified open wound, unspecified lower leg, initial encounter: Secondary | ICD-10-CM

## 2018-08-02 DIAGNOSIS — M79606 Pain in leg, unspecified: Secondary | ICD-10-CM

## 2018-08-02 NOTE — Progress Notes (Signed)
DAVONA, KINOSHITA ([id number]) Visit Report for 08/01/2018 HPI Details Patient Name: Sierra [name], Sierra [name]. Date of Service: 08/01/2018 9:30 AM Medical Record Number: [id number] Patient Account Number: [id number] Date of Birth/Sex: 05/08/1941 (78 y.o. F) Treating RN: Huel Coventry Primary Care Provider: Lucy Chris Other Clinician: Referring Provider: Lucy Chris Treating Provider/Extender: Altamese [place] in Treatment: 1 History of Present Illness HPI Description: Admission 07/25/2018 Patient is a 78 year old woman who lives alone in [place]. She was referred here after recently establishing primary care. I get the sense that she has been ignoring medical care for a number of years. Her appointment was sometime last week. It was notable that she had erythema and tenderness predominantly in her left posterior greater than right posterior calf and an antibiotic was prescribed although she still has not picked this up. She plans to do this later today. We do not have records from her primary doctor and I do not know exactly what antibiotic. The history she provides is extremely vague. She talks about increased swelling and erythema in her legs in the last several weeks to 2 months although I suspect it is been there longer than that. She walks with a walker. The patient has weeping areas in the right posterior calf but the most impressive area is a large area in the left posterior calf. 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 She is complaining of pain in her posterior calves bilaterally. Past medical history; there is nothing on her in [place] link. 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 The patient has lost some weight but she states she has been trying to lose some weight. She has quit smoking in 1994. There is no records of her being a diabetic ABIs were noncompressible bilaterally 2/5;. The patient did not want home health in her home. I am not exactly sure what the issue was. She has large areas on the left greater than right posterior calf in the setting of chronic venous inflammation with secondary lymphedema. Her wounds are about the same although her edema control is better. She has her arterial studies on 2/7 Electronic Signature(s) Signed: 08/01/2018 5:51:47 PM By: Baltazar Najjar MD Entered By: Baltazar Najjar on 08/01/2018 10:39:33 [name] ([id number]) -------------------------------------------------------------------------------- Physical Exam Details Patient Name: [name]. Date of Service: 08/01/2018 9:30 AM Medical Record Number: [id number] Patient Account Number: [id number] Date of Birth/Sex: 1941-06-16 (78 y.o. F) Treating RN: Huel Coventry Primary Care Provider: Lucy Chris Other Clinician: Referring Provider: Lucy Chris Treating Provider/Extender: Altamese [place] in Treatment: 1 Constitutional Patient is hypertensive.. Pulse regular and within target range for patient.Marland Kitchen Respirations regular, non-labored and within target range.. Temperature is normal and within the target range for the patient.Marland Kitchen appears in no distress. Eyes Conjunctivae clear. No discharge. Respiratory Respiratory effort is easy and symmetric bilaterally. Rate is normal at rest and on room air.. Cardiovascular Pedal pulses palpable and strong bilaterally.. Lymphatic None palpable in the popliteal or inguinal area. Integumentary (Hair, Skin) Other than chronic venous insufficiency I see no primary cutaneous issues here. Psychiatric No evidence of depression, anxiety, or  agitation. Calm, cooperative, and communicative. Appropriate interactions and affect.. Notes Wound exam; the area  in question is on the posterior left greater than posterior right calf. There is complete loss of epidermal integrity but no deep wound. Some superficial erythema that is nontender and compatible with chronic stasis dermatitis Electronic Signature(s) Signed: 08/01/2018 5:51:47 PM By: Baltazar Najjarobson, Jamarii Banks MD Entered By: Baltazar Najjarobson, Jordie Skalsky on 08/01/2018 10:40:54 Marena ChancyWETHERELL, Tinika M. ([id number]) -------------------------------------------------------------------------------- Physician Orders Details Patient Name: Marena ChancyWETHERELL, Seraphina M. Date of Service: 08/01/2018 9:30 AM Medical Record Number: [id number] Patient Account Number: [id number] Date of Birth/Sex: 11/16/1940 (78 y.o. F) Treating RN: Huel CoventryWoody, Kim Primary Care Provider: [name], [name] Other Clinician: Referring Provider: [name], [name] Treating Provider/Extender: [name], Pinky Ravan G Weeks in Treatment: 1 Verbal / Phone Orders: No Diagnosis Coding Wound Cleansing Wound #1 Right,Medial Lower Leg o Cleanse wound with mild soap and water Wound #2 Right,Lateral Lower Leg o Cleanse wound with mild soap and water Anesthetic (add to Medication List) Wound #1 Right,Medial Lower Leg o Topical Lidocaine 4% cream applied to wound bed prior to debridement (In Clinic Only). Wound #2 Right,Lateral Lower Leg o Topical Lidocaine 4% cream applied to wound bed prior to debridement (In Clinic Only). Skin Barriers/Peri-Wound Care Wound #1 Right,Medial Lower Leg o Triamcinolone Acetonide Ointment (TCA) Wound #2 Right,Lateral Lower Leg o Triamcinolone Acetonide Ointment (TCA) Primary Wound Dressing Wound #1 Right,Medial Lower Leg o Silver Alginate - on draining areas-bilateral lower extremities Wound #2 Right,Lateral Lower Leg o Silver Alginate - on draining areas-bilateral lower extremities Secondary Dressing Wound #1  Right,Medial Lower Leg o XtraSorb Wound #2 Right,Lateral Lower Leg o XtraSorb Dressing Change Frequency Wound #1 Right,Medial Lower Leg o Change dressing every week Wound #2 Right,Lateral Lower Leg o Change dressing every week Follow-up Appointments Marena ChancyWETHERELL, Linsey M. ([id number]) o Return Appointment in 1 week. o Nurse Visit as needed Edema Control o Kerlix and Coban - Bilateral Electronic Signature(s) Signed: 08/01/2018 5:28:11 PM By: Elliot GurneyWoody, BSN, RN, CWS, Kim RN, BSN Signed: 08/01/2018 5:51:47 PM By: Baltazar Najjarobson, Geramy Lamorte MD Entered By: Elliot GurneyWoody, BSN, RN, CWS, Kim on 08/01/2018 10:22:43 Marena ChancyWETHERELL, Rhesa M. ([id number]) -------------------------------------------------------------------------------- Problem List Details Patient Name: Marena ChancyWETHERELL, Zaide M. Date of Service: 08/01/2018 9:30 AM Medical Record Number: [id number] Patient Account Number: [id number] Date of Birth/Sex: 11/16/1940 (78 y.o. F) Treating RN: Huel CoventryWoody, Kim Primary Care Provider: [name], [name] Other Clinician: Referring Provider: [name], [name] Treating Provider/Extender: [name], Beva Remund G Weeks in Treatment: 1 Active Problems ICD-10 Evaluated Encounter Code Description Active Date Today Diagnosis L97.221 Non-pressure chronic ulcer of left calf limited to breakdown of 07/25/2018 No Yes skin L97.211 Non-pressure chronic ulcer of right calf limited to breakdown 07/25/2018 No Yes of skin I87.333 Chronic venous hypertension (idiopathic) with ulcer and 07/25/2018 No Yes inflammation of bilateral lower extremity L03.116 Cellulitis of left lower limb 07/25/2018 No Yes Inactive Problems Resolved Problems Electronic Signature(s) Signed: 08/01/2018 5:51:47 PM By: Baltazar Najjarobson, Vangie Henthorn MD Entered By: Baltazar Najjarobson, Joshva Labreck on 08/01/2018 10:37:19 Marena ChancyWETHERELL, Maleia M. ([id number]) -------------------------------------------------------------------------------- Progress Note Details Patient Name: Marena ChancyWETHERELL, Viana  M. Date of Service: 08/01/2018 9:30 AM Medical Record Number: [id number] Patient Account Number: [id number] Date of Birth/Sex: 11/16/1940 (78 y.o. F) Treating RN: Huel CoventryWoody, Kim Primary Care Provider: [name], [name] Other Clinician: Referring Provider: [name], [name] Treating Provider/Extender: [name], Ogechi Kuehnel G Weeks in Treatment: 1 Subjective History of Present Illness (HPI) Admission 07/25/2018 Patient is a 78 year old woman who [place] alone [place]. She was referred here after recently establishing primary care. I get the sense that she has been ignoring medical care for a number of years. Her appointment was sometime last week. It was notable that  she had erythema and tenderness predominantly in her left posterior greater than right posterior calf and an antibiotic was prescribed although she still has not picked this up. She plans to do this later today. We do not have records from her primary doctor and I do not know exactly what antibiotic. The history she provides is extremely vague. She talks about increased swelling and erythema in her legs in the last several weeks to 2 months although I suspect it is been there longer than that. She walks with a walker. The patient has weeping areas in the right posterior calf but the most impressive area is a large area in the left posterior calf. 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 She is complaining of pain in her posterior calves bilaterally. Past medical history; there is nothing on her in [place] link. 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 The patient has lost some weight but she states  she has been trying to lose some weight. She has quit smoking in 1994. There is no records of her being a diabetic ABIs were noncompressible bilaterally 2/5;. The patient did not want home health in her home. I am not exactly sure what the issue was. She has large areas on the left greater than right posterior calf in the setting of chronic venous inflammation with secondary lymphedema. Her wounds are about the same although her edema control is better. She has her arterial studies on 2/7 Objective Constitutional Patient is hypertensive.. Pulse regular and within target range for patient.Marland Kitchen Respirations regular, non-labored and within target range.. Temperature is normal and within the target range for the patient.Marland Kitchen appears in no distress. Vitals Time Taken: 9:42 AM, Temperature: 98.3 F, Pulse: 111 bpm, Respiratory Rate: 18 breaths/min, Blood Pressure: 154/78 mmHg. Eyes Conjunctivae clear. No discharge. EZARIAH, WELTMAN M. ([id number]) Respiratory Respiratory effort is easy and symmetric bilaterally. Rate is normal at rest and on room air.. Cardiovascular Pedal pulses palpable and strong bilaterally.. Lymphatic None palpable in the popliteal or inguinal area. Psychiatric No evidence of depression, anxiety, or agitation. Calm, cooperative, and communicative. Appropriate interactions and affect.. General Notes: Wound exam; the area in question is on the posterior left greater than posterior right calf. There is complete loss of epidermal integrity but no deep wound. Some superficial erythema that is nontender and compatible with chronic stasis dermatitis Integumentary (Hair, Skin) Other than chronic venous insufficiency I see no primary cutaneous issues here. Wound #1 status is Open. Original cause of wound was Gradually Appeared. The wound is located on the Right,Medial Lower Leg. The wound measures 8cm length x 5cm width x 0.1cm depth; 31.416cm^2 area and 3.142cm^3 volume. The wound  is limited to skin breakdown. There is no tunneling or undermining noted. There is a large amount of serous drainage noted. The wound margin is indistinct and nonvisible. There is no granulation within the wound bed. There is no necrotic tissue within the wound bed. The periwound skin appearance exhibited: Hemosiderin Staining. The periwound skin appearance did not exhibit: Callus, Crepitus, Excoriation, Induration, Rash, Scarring, Dry/Scaly, Maceration, Atrophie Blanche, Cyanosis, Ecchymosis, Mottled, Pallor, Rubor, Erythema. Periwound temperature was noted as No Abnormality. The periwound has tenderness on palpation. Wound #2 status  is Open. Original cause of wound was Gradually Appeared. The wound is located on the Right,Lateral Lower Leg. The wound measures 8cm length x 3cm width x 0.1cm depth; 18.85cm^2 area and 1.885cm^3 volume. The wound is limited to skin breakdown. There is no tunneling or undermining noted. There is a large amount of serous drainage noted. The wound margin is indistinct and nonvisible. There is no granulation within the wound bed. There is no necrotic tissue within the wound bed. The periwound skin appearance exhibited: Hemosiderin Staining. The periwound skin appearance did not exhibit: Callus, Crepitus, Excoriation, Induration, Rash, Scarring, Dry/Scaly, Maceration, Atrophie Blanche, Cyanosis, Ecchymosis, Mottled, Pallor, Rubor, Erythema. Periwound temperature was noted as No Abnormality. 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 ([id number]) Wound Cleansing: Wound #1 Right,Medial Lower Leg: Cleanse wound with mild soap and water Wound #2 Right,Lateral Lower Leg: Cleanse wound  with mild soap and water Anesthetic (add to Medication List): Wound #1 Right,Medial Lower Leg: Topical Lidocaine 4% cream applied to wound bed prior to debridement (In Clinic Only). Wound #2 Right,Lateral Lower Leg: Topical Lidocaine 4% cream applied to wound bed prior to debridement (In Clinic Only). Skin Barriers/Peri-Wound Care: Wound #1 Right,Medial Lower Leg: Triamcinolone Acetonide Ointment (TCA) Wound #2 Right,Lateral Lower Leg: Triamcinolone Acetonide Ointment (TCA) Primary Wound Dressing: Wound #1 Right,Medial Lower Leg: Silver Alginate - on draining areas-bilateral lower extremities Wound #2 Right,Lateral Lower Leg: Silver Alginate - on draining areas-bilateral lower extremities Secondary Dressing: Wound #1 Right,Medial Lower Leg: XtraSorb Wound #2 Right,Lateral Lower Leg: XtraSorb Dressing Change Frequency: Wound #1 Right,Medial Lower Leg: Change dressing every week Wound #2 Right,Lateral Lower Leg: Change dressing every week Follow-up Appointments: Return Appointment in 1 week. Nurse Visit as needed Edema Control: Kerlix and Coban - Bilateral 1. Continue with TCA/silver alginate/extra sorb and Kerlix Coban bilaterally 2. The patient has arterial studies on 2/7 if these are satisfactory I will increase the compression on her legs. 3. I see no evidence of concurrent cellulitis right now. The patient is still supposed to been on doxycycline prescribed by primary care. 4. She absolutely does not want home health Electronic Signature(s) Signed: 08/01/2018 5:51:47 PM By: Baltazar Najjarobson, Cote Mayabb MD Entered By: Baltazar Najjarobson, Laney Bagshaw on 08/01/2018 10:42:12 Marena ChancyWETHERELL, Katee M. ([id number]) -------------------------------------------------------------------------------- SuperBill Details Patient Name: Marena ChancyWETHERELL, Sojourner M. Date of Service: 08/01/2018 Medical Record Number: [id number] Patient Account Number: [id number] Date of Birth/Sex: 07/03/1940 (78 y.o. 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 L03.116 Cellulitis of left lower limb Facility Procedures CPT4 Code: [id number] Description: 99213 - WOUND CARE VISIT-LEV 3 EST PT Modifier: Quantity: 1 Physician Procedures CPT4: Description Modifier Quantity Code [id number] 99213 - WC PHYS LEVEL 3 - EST PT 1 ICD-10 Diagnosis Description L97.221 Non-pressure chronic ulcer of left calf limited to breakdown of skin L97.211 Non-pressure chronic ulcer of right calf limited to  breakdown of skin I87.333 Chronic venous hypertension (idiopathic) with ulcer and inflammation of bilateral lower extremity Electronic Signature(s)  Signed: 08/01/2018 5:51:47 PM By: Baltazar Najjar MD Entered By: Baltazar Najjar on 08/01/2018 10:42:31

## 2018-08-03 ENCOUNTER — Ambulatory Visit (INDEPENDENT_AMBULATORY_CARE_PROVIDER_SITE_OTHER): Payer: Medicare Other

## 2018-08-03 DIAGNOSIS — M7989 Other specified soft tissue disorders: Secondary | ICD-10-CM

## 2018-08-03 DIAGNOSIS — M79606 Pain in leg, unspecified: Secondary | ICD-10-CM

## 2018-08-03 DIAGNOSIS — S81809A Unspecified open wound, unspecified lower leg, initial encounter: Secondary | ICD-10-CM

## 2018-08-03 DIAGNOSIS — R600 Localized edema: Secondary | ICD-10-CM

## 2018-08-03 NOTE — Progress Notes (Signed)
Sierra [name] ([id number]) Visit Report for 08/01/2018 Arrival Information Details Patient Name: Sierra [name], Sierra [name]. Date of Service: 08/01/2018 9:30 AM Medical Record Number: [id number] Patient Account Number: [id number] Date of Birth/Sex: March 26, 1941 (77 y.o. F) Treating RN: Huel Coventry Primary Care Etola Mull: Lucy Chris Other Clinician: Referring Denetria Luevanos: Lucy Chris Treating Artavis Cowie/Extender: Altamese [place] in Treatment: 1 Visit Information History Since Last Visit Added or deleted any medications: No Patient Arrived: Wheel Chair Any new allergies or adverse reactions: No Arrival Time: 09:41 Had a fall or experienced change in No Accompanied By: self activities of daily living that may affect Transfer Assistance: Manual risk of falls: Patient Identification Verified: Yes Signs or symptoms of abuse/neglect since last visito No Secondary Verification Process Yes Hospitalized since last visit: No Completed: Implantable device outside of the clinic excluding No Patient Has Alerts: Yes cellular tissue based products placed in the center Patient Alerts: ABI [place] BILATERAL since last visit: >220 Has Dressing in Place as Prescribed: Yes Pain Present Now: No Electronic Signature(s) Signed: 08/01/2018 11:35:01 AM By: Dayton Martes RCP, RRT, CHT Entered By: Weyman Rodney, Lucio Edward on 08/01/2018 09:41:51 Sierra [name] ([id number]) -------------------------------------------------------------------------------- Clinic Level of Care Assessment Details Patient Name: Sierra [name]. Date of Service: 08/01/2018 9:30 AM Medical Record Number: [id number] Patient Account Number: [id number] Date of Birth/Sex: Nov 21, 1940 (77 y.o. F) Treating RN: Huel Coventry Primary Care Anacleto Batterman: Lucy Chris Other Clinician: Referring Alithia Zavaleta: Lucy Chris Treating Carling Liberman/Extender: Altamese [place] in Treatment: 1 Clinic Level of  Care Assessment Items TOOL 4 Quantity Score []  - Use when only an EandM is performed on FOLLOW-UP visit 0 ASSESSMENTS - Nursing Assessment / Reassessment []  - Reassessment of Co-morbidities (includes updates in patient status) 0 X- 1 5 Reassessment of Adherence to Treatment Plan ASSESSMENTS - Wound and Skin Assessment / Reassessment []  - Simple Wound Assessment / Reassessment - one wound 0 X- 2 5 Complex Wound Assessment / Reassessment - multiple wounds []  - 0 Dermatologic / Skin Assessment (not related to wound area) ASSESSMENTS - Focused Assessment []  - Circumferential Edema Measurements - multi extremities 0 []  - 0 Nutritional Assessment / Counseling / Intervention []  - 0 Lower Extremity Assessment (monofilament, tuning fork, pulses) []  - 0 Peripheral Arterial Disease Assessment (using hand held doppler) ASSESSMENTS - Ostomy and/or Continence Assessment and Care []  - Incontinence Assessment and Management 0 []  - 0 Ostomy Care Assessment and Management (repouching, etc.) PROCESS - Coordination of Care X - Simple Patient / Family Education for ongoing care 1 15 []  - 0 Complex (extensive) Patient / Family Education for ongoing care X- 1 10 Staff obtains Chiropractor, Records, Test Results / Process Orders []  - 0 Staff telephones HHA, Nursing Homes / Clarify orders / etc []  - 0 Routine Transfer to another Facility (non-emergent condition) []  - 0 Routine Hospital Admission (non-emergent condition) []  - 0 New Admissions / Manufacturing engineer / Ordering NPWT, Apligraf, etc. []  - 0 Emergency Hospital Admission (emergent condition) X- 1 10 Simple Discharge Coordination Sierra [name], Sierra [name]. ([id number]) []  - 0 Complex (extensive) Discharge Coordination PROCESS - Special Needs []  - Pediatric / Minor Patient Management 0 []  - 0 Isolation Patient Management []  - 0 Hearing / Language / Visual special needs []  - 0 Assessment of Community assistance (transportation, D/C  planning, etc.) []  - 0 Additional assistance / Altered mentation []  - 0 Support Surface(s) Assessment (bed, cushion, seat, etc.) INTERVENTIONS - Wound Cleansing / Measurement []  - Simple Wound Cleansing - one wound 0 X-  2 5 Complex Wound Cleansing - multiple wounds X- 1 5 Wound Imaging (photographs - any number of wounds) []  - 0 Wound Tracing (instead of photographs) X- 1 5 Simple Wound Measurement - one wound []  - 0 Complex Wound Measurement - multiple wounds INTERVENTIONS - Wound Dressings []  - Small Wound Dressing one or multiple wounds 0 []  - 0 Medium Wound Dressing one or multiple wounds X- 2 20 Large Wound Dressing one or multiple wounds []  - 0 Application of Medications - topical []  - 0 Application of Medications - injection INTERVENTIONS - Miscellaneous []  - External ear exam 0 []  - 0 Specimen Collection (cultures, biopsies, blood, body fluids, etc.) []  - 0 Specimen(s) / Culture(s) sent or taken to Lab for analysis []  - 0 Patient Transfer (multiple staff / Nurse, adult / Similar devices) []  - 0 Simple Staple / Suture removal (25 or less) []  - 0 Complex Staple / Suture removal (26 or more) []  - 0 Hypo / Hyperglycemic Management (close monitor of Blood Glucose) []  - 0 Ankle / Brachial Index (ABI) - do not check if billed separately X- 1 5 Vital Signs Sierra [name], Sierra [name]. ([id number]) Has the patient been seen at the hospital within the last three years: Yes Total Score: 115 Level Of Care: New/Established - Level 3 Electronic Signature(s) Signed: 08/01/2018 5:28:11 PM By: Elliot Gurney, BSN, RN, CWS, Kim RN, BSN Entered By: Elliot Gurney, BSN, RN, CWS, Kim on 08/01/2018 10:20:16 Sierra [name] ([id number]) -------------------------------------------------------------------------------- Encounter Discharge Information Details Patient Name: Sierra [name]. Date of Service: 08/01/2018 9:30 AM Medical Record Number: [id number] Patient Account Number:  [id number] Date of Birth/Sex: [birth year] (77 y.o. F) Treating RN: Arnette Norris Primary Care Amanada Philbrick: Lucy Chris Other Clinician: Referring Eduin Friedel: Lucy Chris Treating Aashir Umholtz/Extender: Altamese [place] in Treatment: 1 Encounter Discharge Information Items Discharge Condition: Stable Ambulatory Status: Wheelchair Discharge Destination: Home Transportation: Private Auto Accompanied By: self Schedule Follow-up Appointment: Yes Clinical Summary of Care: Electronic Signature(s) Signed: 08/02/2018 11:43:02 AM By: Arnette Norris Entered By: Arnette Norris on 08/01/2018 10:46:27 Sierra [name] ([id number]) -------------------------------------------------------------------------------- Lower Extremity Assessment Details Patient Name: Sierra [name]. Date of Service: 08/01/2018 9:30 AM Medical Record Number: [id number] Patient Account Number: [id number] Date of Birth/Sex: 1940/11/08 (77 y.o. F) Treating RN: Curtis Sites Primary Care Nezar Buckles: Lucy Chris Other Clinician: Referring Trenise Turay: Lucy Chris Treating Kevin Space/Extender: Altamese [place] in Treatment: 1 Edema Assessment Assessed: [Left: No] [Right: No] Edema: [Left: Yes] [Right: Yes] Calf Left: Right: Point of Measurement: 32 cm From Medial Instep 45.3 cm 48.5 cm Ankle Left: Right: Point of Measurement: 12 cm From Medial Instep 26.6 cm 27.2 cm Vascular Assessment Pulses: Dorsalis Pedis Palpable: [Left:Yes] [Right:Yes] Posterior Tibial Extremity colors, hair growth, and conditions: Extremity Color: [Left:Hyperpigmented] [Right:Hyperpigmented] Hair Growth on Extremity: [Left:No] [Right:No] Temperature of Extremity: [Left:Warm] [Right:Warm] Capillary Refill: [Left:< 3 seconds] [Right:< 3 seconds] Toe Nail Assessment Left: Right: Thick: Yes Yes Discolored: Yes Yes Deformed: No No Improper Length and Hygiene: Yes Yes Electronic Signature(s) Signed: 08/01/2018 5:48:53  PM By: Curtis Sites Entered By: Curtis Sites on 08/01/2018 09:50:47 Sierra [name] ([id number]) -------------------------------------------------------------------------------- Multi Wound Chart Details Patient Name: Sierra [name]. Date of Service: 08/01/2018 9:30 AM Medical Record Number: [id number] Patient Account Number: [id number] Date of Birth/Sex: December 11, 1940 (77 y.o. F) Treating RN: Huel Coventry Primary Care Esker Dever: Lucy Chris Other Clinician: Referring Braedon Sjogren: Lucy Chris Treating Briahna Pescador/Extender: Altamese [place] in Treatment: 1 Vital Signs Height(in): Pulse(bpm): 111 Weight(lbs): Blood Pressure(mmHg): 154/78 Body Mass Index(BMI): Temperature(F): 98.3  Respiratory Rate 18 (breaths/min): Photos: [N/A:N/A] Wound Location: Right Lower Leg - Medial Right Lower Leg - Lateral N/A Wounding Event: Gradually Appeared Gradually Appeared N/A Primary Etiology: Lymphedema Lymphedema N/A Comorbid History: Anemia Anemia N/A Date Acquired: 07/24/2018 07/24/2018 N/A Weeks of Treatment: 0 0 N/A Wound Status: Open Open N/A Measurements L x W x D 8x5x0.1 8x3x0.1 N/A (cm) Area (cm) : 31.416 18.85 N/A Volume (cm) : 3.142 1.885 N/A % Reduction in Area: N/A 0.00% N/A % Reduction in Volume: N/A 0.00% N/A Classification: Partial Thickness Partial Thickness N/A Exudate Amount: Large Large N/A Exudate Type: Serous Serous N/A Exudate Color: amber amber N/A Wound Margin: Indistinct, nonvisible Indistinct, nonvisible N/A Granulation Amount: None Present (0%) None Present (0%) N/A Necrotic Amount: None Present (0%) None Present (0%) N/A Exposed Structures: Fascia: No Fascia: No N/A Fat Layer (Subcutaneous Fat Layer (Subcutaneous Tissue) Exposed: No Tissue) Exposed: No Tendon: No Tendon: No Muscle: No Muscle: No Joint: No Joint: No Bone: No Bone: No Limited to Skin Breakdown Limited to Skin Breakdown Epithelialization: Large (67-100%) Large  (67-100%) N/A Sierra [name], Sierra [name]. ([id number]) Periwound Skin Texture: Excoriation: No Excoriation: No N/A Induration: No Induration: No Callus: No Callus: No Crepitus: No Crepitus: No Rash: No Rash: No Scarring: No Scarring: No Periwound Skin Moisture: Maceration: No Maceration: No N/A Dry/Scaly: No Dry/Scaly: No Periwound Skin Color: Hemosiderin Staining: Yes Hemosiderin Staining: Yes N/A Atrophie Blanche: No Atrophie Blanche: No Cyanosis: No Cyanosis: No Ecchymosis: No Ecchymosis: No Erythema: No Erythema: No Mottled: No Mottled: No Pallor: No Pallor: No Rubor: No Rubor: No Temperature: No Abnormality No Abnormality N/A Tenderness on Palpation: Yes No N/A Wound Preparation: Ulcer Cleansing: Other: soap Ulcer Cleansing: Other: soap N/A and water and water Topical Anesthetic Applied: Topical Anesthetic Applied: None None Treatment Notes Electronic Signature(s) Signed: 08/01/2018 5:51:47 PM By: Baltazar Najjar MD Entered By: Baltazar Najjar on 08/01/2018 10:37:27 Sierra [name] ([id number]) -------------------------------------------------------------------------------- Multi-Disciplinary Care Plan Details Patient Name: Sierra [name]. Date of Service: 08/01/2018 9:30 AM Medical Record Number: [id number] Patient Account Number: [id number] Date of Birth/Sex: [birth year] (77 y.o. F) Treating RN: Huel Coventry Primary Care Carmela Piechowski: Lucy Chris Other Clinician: Referring Devona Holmes: Lucy Chris Treating Jhania Etherington/Extender: Altamese [place] in Treatment: 1 Active Inactive Orientation to the Wound Care Program Nursing Diagnoses: Knowledge deficit related to the wound healing center program Goals: Patient/caregiver will verbalize understanding of the Wound Healing Center Program Date Initiated: 07/25/2018 Target Resolution Date: 08/25/2018 Goal Status: Active Interventions: Provide education on orientation to the wound  center Notes: Soft Tissue Infection Nursing Diagnoses: Impaired tissue integrity Goals: Patient's soft tissue infection will resolve Date Initiated: 07/25/2018 Target Resolution Date: 08/25/2018 Goal Status: Active Interventions: Assess signs and symptoms of infection every visit Treatment Activities: Systemic antibiotics : 07/25/2018 Notes: Venous Leg Ulcer Nursing Diagnoses: Potential for venous Insuffiency (use before diagnosis confirmed) Goals: Patient will maintain optimal edema control Date Initiated: 07/25/2018 Target Resolution Date: 08/25/2018 Goal Status: Active Sierra [name], Sierra [name]. ([id number]) Interventions: Assess peripheral edema status every visit. Provide education on venous insufficiency Notes: Wound/Skin Impairment Nursing Diagnoses: Impaired tissue integrity Knowledge deficit related to smoking impact on wound healing Goals: Ulcer/skin breakdown will have a volume reduction of 30% by week 4 Date Initiated: 07/25/2018 Target Resolution Date: 08/25/2018 Goal Status: Active Interventions: Assess ulceration(s) every visit Treatment Activities: Topical wound management initiated : 07/25/2018 Notes: Electronic Signature(s) Signed: 08/01/2018 5:28:11 PM By: Elliot Gurney, BSN, RN, CWS, Kim RN, BSN Entered By: Elliot Gurney, BSN, RN, CWS, Kim on 08/01/2018 10:17:33 Rusk, Kennith Gain. (  [id number]) -------------------------------------------------------------------------------- Non-Wound Condition Assessment Details Patient Name: Sierra [name], [name] Date of Service: 08/01/2018 9:30 AM Medical Record Number: [id number] Patient Account Number: [id number] Date of Birth/Sex: [birth year] (77 y.o. F) Treating RN: Curtis Sitesorthy, Joanna Primary Care Donyelle Enyeart: [name], [name] Other Clinician: Referring Tamaiya Bump: [name], [name] Treating [place] Droll/Extender: [name], [name] Weeks in Treatment: 1 Non-Wound Condition: Condition: Lymphedema Location: Leg Side: Bilateral Photos Periwound  Skin Texture Texture Color No Abnormalities Noted: No No Abnormalities Noted: No Callus: No Atrophie Blanche: No Crepitus: No Cyanosis: No Excoriation: No Ecchymosis: No Friable: No Erythema: Yes Induration: No Hemosiderin Staining: Yes Rash: No Mottled: No Scarring: No Pallor: No Rubor: No Moisture No Abnormalities Noted: No Temperature / Pain Dry / Scaly: No Temperature: No Abnormality Maceration: No Tenderness on Palpation: Yes Electronic Signature(s) Signed: 08/01/2018 10:18:48 AM By: Curtis Sitesorthy, Joanna Entered By: Curtis Sitesorthy, Joanna on 08/01/2018 10:18:48 Sierra [name], [name] ([id number]) -------------------------------------------------------------------------------- Pain Assessment Details Patient Name: Sierra [name], [name] Date of Service: 08/01/2018 9:30 AM Medical Record Number: [id number] Patient Account Number: [id number] Date of Birth/Sex: [birth year] (77 y.o. F) Treating RN: Huel CoventryWoody, Kim Primary Care Randeep Biondolillo: [name], [name] Other Clinician: Referring Siaosi Alter: [name], [name] Treating Kody Vigil/Extender: [name], [name] Weeks in Treatment: 1 Active Problems Location of Pain Severity and Description of Pain Patient Has Paino No Site Locations Pain Management and Medication Current Pain Management: Electronic Signature(s) Signed: 08/01/2018 11:35:01 AM By: Dayton MartesWallace, RCP,RRT,CHT, Sallie RCP, RRT, CHT Signed: 08/01/2018 5:28:11 PM By: Elliot GurneyWoody, BSN, RN, CWS, Kim RN, BSN Entered By: Dayton MartesWallace, RCP,RRT,CHT, Sallie on 08/01/2018 09:41:59 Sierra [name], [name] ([id number]) -------------------------------------------------------------------------------- Patient/Caregiver Education Details Patient Name: Sierra [name], [name] Date of Service: 08/01/2018 9:30 AM Medical Record Number: [id number] Patient Account Number: [id number] Date of Birth/Gender: [birth year] (77 y.o. F) Treating RN: Huel CoventryWoody, Kim Primary Care Physician: [name], [name] Other Clinician: Referring Physician:  [name], [name] Treating Physician/Extender: [name], [name] Weeks in Treatment: 1 Education Assessment Education Provided To: Patient Education Topics Provided Venous: Handouts: Other: Appointment 2/7 Methods: Demonstration, Explain/Verbal Responses: State content correctly Wound/Skin Impairment: Handouts: Caring for Your Ulcer, Other: continue wound care as prescribed Methods: Demonstration, Explain/Verbal Responses: State content correctly Electronic Signature(s) Signed: 08/02/2018 11:43:02 AM By: Arnette NorrisBiell, Kristina Entered By: Arnette NorrisBiell, Kristina on 08/01/2018 10:46:31 Sierra [name], Arretta M. ([id number]) -------------------------------------------------------------------------------- Wound Assessment Details Patient Name: Sierra [name], Anisten M. Date of Service: 08/01/2018 9:30 AM Medical Record Number: [id number] Patient Account Number: [id number] Date of Birth/Sex: [birth year] (77 y.o. F) Treating RN: Curtis Sitesorthy, Joanna Primary Care Kenidee Cregan: [name], [name] Other Clinician: Referring Yuji Walth: [name], [name] Treating Tyneka Scafidi/Extender: [name], [name] Weeks in Treatment: 1 Wound Status Wound Number: 1 Primary Etiology: Lymphedema Wound Location: Right Lower Leg - Medial Wound Status: Open Wounding Event: Gradually Appeared Comorbid History: Anemia Date Acquired: 07/24/2018 Weeks Of Treatment: 0 Clustered Wound: No Photos Photo Uploaded By: Curtis Sitesorthy, Joanna on 08/01/2018 10:17:43 Wound Measurements Length: (cm) 8 Width: (cm) 5 Depth: (cm) 0.1 Area: (cm) 31.416 Volume: (cm) 3.142 % Reduction in Area: % Reduction in Volume: Epithelialization: Large (67-100%) Tunneling: No Undermining: No Wound Description Classification: Partial Thickness Wound Margin: Indistinct, nonvisible Exudate Amount: Large Exudate Type: Serous Exudate Color: amber Foul Odor After Cleansing: No Slough/Fibrino No Wound Bed Granulation Amount: None Present (0%) Exposed Structure Necrotic  Amount: None Present (0%) Fascia Exposed: No Fat Layer (Subcutaneous Tissue) Exposed: No Tendon Exposed: No Muscle Exposed: No Joint Exposed: No Bone Exposed: No Limited to Skin Breakdown Periwound Skin Texture Sierra [name], Donn M. ([id number]) Texture Color No Abnormalities Noted: No No Abnormalities Noted: No Callus: No Atrophie Blanche: No Crepitus:  No Cyanosis: No Excoriation: No Ecchymosis: No Induration: No Erythema: No Rash: No Hemosiderin Staining: Yes Scarring: No Mottled: No Pallor: No Moisture Rubor: No No Abnormalities Noted: No Dry / Scaly: No Temperature / Pain Maceration: No Temperature: No Abnormality Tenderness on Palpation: Yes Wound Preparation Ulcer Cleansing: Other: soap and water, Topical Anesthetic Applied: None Treatment Notes Wound #1 (Right, Medial Lower Leg) Notes silver alginate, Xtrasorb, kerlix and Theatre manager) Signed: 08/01/2018 5:48:53 PM By: Curtis Sites Entered By: Curtis Sites on 08/01/2018 09:57:44 Sierra [name] ([id number]) -------------------------------------------------------------------------------- Wound Assessment Details Patient Name: Sierra [name]. Date of Service: 08/01/2018 9:30 AM Medical Record Number: [id number] Patient Account Number: [id number] Date of Birth/Sex: 1941/01/16 (77 y.o. F) Treating RN: Curtis Sites Primary Care Gaylord Seydel: Lucy Chris Other Clinician: Referring Meena Barrantes: Lucy Chris Treating Yannis Broce/Extender: Altamese [place] in Treatment: 1 Wound Status Wound Number: 2 Primary Etiology: Lymphedema Wound Location: Right Lower Leg - Lateral Wound Status: Open Wounding Event: Gradually Appeared Comorbid History: Anemia Date Acquired: 07/24/2018 Weeks Of Treatment: 0 Clustered Wound: No Photos Photo Uploaded By: Curtis Sites on 08/01/2018 10:17:44 Wound Measurements Length: (cm) 8 Width: (cm) 3 Depth: (cm) 0.1 Area: (cm)  18.85 Volume: (cm) 1.885 % Reduction in Area: 0% % Reduction in Volume: 0% Epithelialization: Large (67-100%) Tunneling: No Undermining: No Wound Description Classification: Partial Thickness Wound Margin: Indistinct, nonvisible Exudate Amount: Large Exudate Type: Serous Exudate Color: amber Foul Odor After Cleansing: No Slough/Fibrino No Wound Bed Granulation Amount: None Present (0%) Exposed Structure Necrotic Amount: None Present (0%) Fascia Exposed: No Fat Layer (Subcutaneous Tissue) Exposed: No Tendon Exposed: No Muscle Exposed: No Joint Exposed: No Bone Exposed: No Limited to Skin Breakdown Periwound Skin Texture FAELYNN, WYNDER ([id number]) Texture Color No Abnormalities Noted: No No Abnormalities Noted: No Callus: No Atrophie Blanche: No Crepitus: No Cyanosis: No Excoriation: No Ecchymosis: No Induration: No Erythema: No Rash: No Hemosiderin Staining: Yes Scarring: No Mottled: No Pallor: No Moisture Rubor: No No Abnormalities Noted: No Dry / Scaly: No Temperature / Pain Maceration: No Temperature: No Abnormality Wound Preparation Ulcer Cleansing: Other: soap and water, Topical Anesthetic Applied: None Treatment Notes Wound #2 (Right, Lateral Lower Leg) Notes silver alginate, Xtrasorb, kerlix and Theatre manager) Signed: 08/01/2018 5:48:53 PM By: Curtis Sites Entered By: Curtis Sites on 08/01/2018 10:00:00 Sierra [name] ([id number]) -------------------------------------------------------------------------------- Vitals Details Patient Name: Sierra [name]. Date of Service: 08/01/2018 9:30 AM Medical Record Number: [id number] Patient Account Number: [id number] Date of Birth/Sex: 1941-06-14 (77 y.o. F) Treating RN: Huel Coventry Primary Care Tamina Cyphers: Lucy Chris Other Clinician: Referring Markese Bloxham: Lucy Chris Treating Aragon Scarantino/Extender: Altamese [place] in Treatment: 1 Vital Signs Time  Taken: 09:42 Temperature (F): 98.3 Pulse (bpm): 111 Respiratory Rate (breaths/min): 18 Blood Pressure (mmHg): 154/78 Reference Range: 80 - 120 mg / dl Airway Electronic Signature(s) Signed: 08/01/2018 11:35:01 AM By: Dayton Martes RCP, RRT, CHT Entered By: Dayton Martes on 08/01/2018 09:44:25

## 2018-08-08 ENCOUNTER — Ambulatory Visit
Admission: RE | Admit: 2018-08-08 | Discharge: 2018-08-08 | Disposition: A | Discharge status: Home / Self Care | Payer: Medicare Other | Source: Ambulatory Visit | Attending: Family Medicine | Admitting: Family Medicine

## 2018-08-08 ENCOUNTER — Encounter: Payer: Medicare Other | Admitting: Internal Medicine

## 2018-08-08 DIAGNOSIS — R609 Edema, unspecified: Secondary | ICD-10-CM

## 2018-08-08 DIAGNOSIS — I87333 Chronic venous hypertension (idiopathic) with ulcer and inflammation of bilateral lower extremity: Secondary | ICD-10-CM | POA: Diagnosis not present

## 2018-08-08 DIAGNOSIS — R600 Localized edema: Secondary | ICD-10-CM | POA: Diagnosis not present

## 2018-08-08 DIAGNOSIS — I340 Nonrheumatic mitral (valve) insufficiency: Secondary | ICD-10-CM | POA: Insufficient documentation

## 2018-08-08 NOTE — Progress Notes (Signed)
*  PRELIMINARY RESULTS* Echocardiogram 2D Echocardiogram has been performed.  Sierra [name] 08/08/2018, 12:38 PM

## 2018-08-10 NOTE — Progress Notes (Signed)
Sierra, [name] ([id number]) Visit Report for 08/08/2018 HPI Details Patient Name: Sierra [name], Sierra [name]. Date of Service: 08/08/2018 2:15 PM Medical Record Number: [id number] Patient Account Number: [id number] Date of Birth/Sex: 09/22/1940 (77 y.o. F) Treating RN: Huel Coventry Primary Care Provider: Lucy Chris Other Clinician: Referring Provider: Lucy Chris Treating Provider/Extender: Altamese [place] in Treatment: 2 History of Present Illness HPI Description: Admission 07/25/2018 Patient is a 78 year old woman who lives alone in [place]. She was referred here after recently establishing primary care. I get the sense that she has been ignoring medical care for a number of years. Her appointment was sometime last week. It was notable that she had erythema and tenderness predominantly in her left posterior greater than right posterior calf and an antibiotic was prescribed although she still has not picked this up. She plans to do this later today. We do not have records from her primary doctor and I do not know exactly what antibiotic. The history she provides is extremely vague. She talks about increased swelling and erythema in her legs in the last several weeks to 2 months although I suspect it is been there longer than that. She walks with a walker. The patient has weeping areas in the right posterior calf but the most impressive area is a large area in the left posterior calf. 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 She is complaining of pain in her posterior calves bilaterally. Past medical history; there is nothing on her in [place] link. 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 The patient has lost some weight but she states she has been trying to lose some weight. She has quit smoking in 1994. There is no records of her being a diabetic ABIs were noncompressible bilaterally 2/5;. The patient did not want home health in her home. I am not exactly sure what the issue was. She has large areas on the left greater than right posterior calf in the setting of chronic venous inflammation with secondary lymphedema. Her wounds are about the same although her edema control is better. She has her arterial studies on 2/7 2/12; patient's arterial studies were actually quite good with an ABI of 1.28 on the right with triphasic waveforms and 1.31 on the left also with triphasic waveforms. Not felt to have significant arterial disease. The patient has smaller areas on the posterior right calf draining area on the left anterior tibial area and the left lateral calf. And flaky. Electronic Signature(s) Signed: 08/08/2018 6:19:03 PM By: Baltazar Najjar MD Entered By: Baltazar Najjar on 08/08/2018 15:25:33 Sierra [name] ([id number]) -------------------------------------------------------------------------------- Physical Exam Details Patient Name: Sierra [name]. Date of Service: 08/08/2018 2:15 PM Medical Record Number: [id number] Patient Account Number: [id number] Date of Birth/Sex: 07/28/1940 (77 y.o. F) Treating RN: Huel Coventry Primary Care Provider: Lucy Chris Other Clinician: Referring Provider: Lucy Chris Treating Provider/Extender: Altamese [place] in Treatment: 2 Constitutional Patient is hypertensive.. Pulse regular and within target range for patient.Marland Kitchen Respirations regular, non-labored and within target range.. Temperature is normal and within the target range for the patient.Marland Kitchen appears in no distress. Eyes Conjunctivae clear. No discharge. Respiratory Respiratory effort  is easy and symmetric bilaterally. Rate  is normal at rest and on room air.. Cardiovascular Pedal pulses palpable. Edema control is better. Lymphatic None palpable in the popliteal area bilaterally. Integumentary (Hair, Skin) Chronic venous inflammation and dry flaking skin especially on the left leg greater than the right.. Psychiatric No evidence of depression, anxiety, or agitation. Calm, cooperative, and communicative. Appropriate interactions and affect.. Notes Wound exam; the area in question is on the posterior right calf x2 and a small area on the left anterior just below the tibial tuberosity and finally an area on the left posterior calf. In general things look a lot better and her edema control is better. Electronic Signature(s) Signed: 08/08/2018 6:19:03 PM By: Baltazar Najjar MD Entered By: Baltazar Najjar on 08/08/2018 15:27:44 Sierra [name] ([id number]) -------------------------------------------------------------------------------- Physician Orders Details Patient Name: Sierra [name]. Date of Service: 08/08/2018 2:15 PM Medical Record Number: [id number] Patient Account Number: [id number] Date of Birth/Sex: August 08, 1940 (77 y.o. F) Treating RN: Huel Coventry Primary Care Provider: Lucy Chris Other Clinician: Referring Provider: Lucy Chris Treating Provider/Extender: Altamese [place] in Treatment: 2 Verbal / Phone Orders: No Diagnosis Coding Wound Cleansing Wound #1 Right,Medial Lower Leg o Cleanse wound with mild soap and water Wound #2 Right,Lateral Lower Leg o Cleanse wound with mild soap and water Anesthetic (add to Medication List) Wound #1 Right,Medial Lower Leg o Topical Lidocaine 4% cream applied to wound bed prior to debridement (In Clinic Only). Wound #2 Right,Lateral Lower Leg o Topical Lidocaine 4% cream applied to wound bed prior to debridement (In Clinic Only). Skin Barriers/Peri-Wound Care Wound #1 Right,Medial  Lower Leg o Triamcinolone Acetonide Ointment (TCA) Wound #2 Right,Lateral Lower Leg o Triamcinolone Acetonide Ointment (TCA) Primary Wound Dressing Wound #1 Right,Medial Lower Leg o Silver Alginate - on draining areas-bilateral lower extremities Wound #2 Right,Lateral Lower Leg o Silver Alginate - on draining areas-bilateral lower extremities Secondary Dressing Wound #1 Right,Medial Lower Leg o XtraSorb Wound #2 Right,Lateral Lower Leg o XtraSorb Dressing Change Frequency Wound #1 Right,Medial Lower Leg o Change dressing every week Wound #2 Right,Lateral Lower Leg o Change dressing every week Follow-up Appointments MCKENLEY, GAUVIN. ([id number]) Wound #1 Right,Medial Lower Leg o Return Appointment in 1 week. o Nurse Visit as needed Wound #2 Right,Lateral Lower Leg o Return Appointment in 1 week. o Nurse Visit as needed Edema Control Wound #1 Right,Medial Lower Leg o 3 Layer Compression System - Bilateral Wound #2 Right,Lateral Lower Leg o 3 Layer Compression System - Bilateral Notes Patient to order compression stockings from [facility]. (R) 48.3 calf 27.2 ankle Heel to Knee 44 (L) 45 calf, 26 ankle, 44 heel to knee Electronic Signature(s) Signed: 08/08/2018 6:19:03 PM By: Baltazar Najjar MD Signed: 08/09/2018 8:43:41 AM By: Elliot Gurney, BSN, RN, CWS, Kim RN, BSN Entered By: Elliot Gurney, BSN, RN, CWS, Kim on 08/08/2018 14:59:17 Sierra [name] ([id number]) -------------------------------------------------------------------------------- Problem List Details Patient Name: ALONA, GUNNELLS. Date of Service: 08/08/2018 2:15 PM Medical Record Number: [id number] Patient Account Number: [id number] Date of Birth/Sex: [birth year] (77 y.o. 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/25/2018 No Yes skin L97.211 Non-pressure chronic ulcer of right calf limited to breakdown 07/25/2018 No Yes of skin I87.333 Chronic venous hypertension (idiopathic) with ulcer and 07/25/2018 No Yes inflammation of bilateral lower extremity L03.116 Cellulitis of left lower  limb 07/25/2018 No Yes Inactive Problems Resolved Problems Electronic Signature(s) Signed: 08/08/2018 6:19:03 PM By: Baltazar Najjar MD Entered By: Baltazar Najjar on 08/08/2018 15:24:07 Sierra [name] ([id number]) -------------------------------------------------------------------------------- Progress Note Details Patient Name: Sierra [name]. Date of Service: 08/08/2018 2:15 PM Medical Record Number: [id number] Patient Account Number: [id number] Date of Birth/Sex: 11-06-1940 (77 y.o. F) Treating RN: Huel Coventry Primary Care Provider: Lucy Chris Other Clinician: Referring Provider: Lucy Chris Treating Provider/Extender: Altamese [place] in Treatment: 2 Subjective History of Present Illness (HPI) Admission 07/25/2018 Patient is a 78 year old woman who lives alone in [place]. She was referred here after recently establishing primary care. I get the sense that she has been ignoring medical care for a number of years. Her appointment was sometime last week. It was notable that she had erythema and tenderness predominantly in her left posterior greater than right posterior calf and an antibiotic was prescribed although she still has not picked this up. She plans to do this later today. We do not have records from her primary doctor and I do not know exactly what antibiotic. The history she provides is extremely vague. She talks about increased swelling and erythema in her legs in the last several weeks to 2 months although  I suspect it is been there longer than that. She walks with a walker. The patient has weeping areas in the right posterior calf but the most impressive area is a large area in the left posterior calf. 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 She is complaining of pain in her posterior calves bilaterally. Past medical history; there is nothing on her in  link. 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 The patient has lost some weight but she states she has been trying to lose some weight. She has quit smoking in 1994. There is no records of her being a diabetic ABIs were noncompressible bilaterally 2/5;. The patient did not want home health in her home. I am not exactly sure what the issue was. She has large areas on the left greater than right posterior calf in the setting of chronic venous inflammation with secondary lymphedema. Her wounds are about the same although her edema control is better. She has her arterial studies on 2/7 2/12; patient's arterial studies were actually quite good with an ABI of 1.28 on the right with triphasic waveforms and 1.31 on the left also with triphasic waveforms. Not felt to have significant arterial disease. The patient has smaller areas on the posterior right calf draining area on the left anterior tibial area and the left lateral calf. And flaky. Objective Constitutional Patient is hypertensive.. Pulse regular and within target range for patient.Marland Kitchen Respirations regular, non-labored and within target range.. Temperature is normal and within the target range for the patient.Marland Kitchen appears in no distress. Vitals Time Taken: 2:18  PM, Height: 66 in, Source: Stated, Weight: 208 lbs, Source: Stated, BMI: 33.6, Temperature: 97.9 F, Pulse: 95 bpm, Respiratory Rate: 18 breaths/min, Blood Pressure: 161/73 mmHg. RAE, PLOTNER M. ([id number]) Eyes Conjunctivae clear. No discharge. Respiratory Respiratory effort is easy and symmetric bilaterally. Rate is normal at rest and on room air.. Cardiovascular Pedal pulses palpable. Edema control is better.  Lymphatic None palpable in the popliteal area bilaterally. Psychiatric No evidence of depression, anxiety, or agitation. Calm, cooperative, and communicative. Appropriate interactions and affect.. General Notes: Wound exam; the area in question is on the posterior right calf x2 and a small area on the left anterior just below the tibial tuberosity and finally an area on the left posterior calf. In general things look a lot better and her edema control is better. Integumentary (Hair, Skin) Chronic venous inflammation and dry flaking skin especially on the left leg greater than the right.. Wound #1 status is Open. Original cause of wound was Gradually Appeared. The wound is located on the Left,Medial Lower Leg. The wound measures 0.1cm length x 0.1cm width x 0.1cm depth; 0.008cm^2 area and 0.001cm^3 volume. The wound is limited to skin breakdown. There is a large amount of serous drainage noted. The wound margin is indistinct and nonvisible. There is no granulation within the wound bed. There is no necrotic tissue within the wound bed. The periwound skin appearance exhibited: Hemosiderin Staining. The periwound skin appearance did not exhibit: Callus, Crepitus, Excoriation, Induration, Rash, Scarring, Dry/Scaly, Maceration, Atrophie Blanche, Cyanosis, Ecchymosis, Mottled, Pallor, Rubor, Erythema. Periwound temperature was noted as No Abnormality. The periwound has tenderness on palpation. Wound #2 status is Open. Original cause of wound was Gradually Appeared. The wound is  located on the Right,Lateral Lower Leg. The wound measures 1.5cm length x 0.5cm width x 0.1cm depth; 0.589cm^2 area and 0.059cm^3 volume. The wound is limited to skin breakdown. There is no tunneling or undermining noted. There is a large amount of serous drainage noted. The wound margin is indistinct and nonvisible. There is no granulation within the wound bed. There is no necrotic tissue within the wound bed. The periwound skin appearance exhibited: Hemosiderin Staining. The periwound skin appearance did not exhibit: Callus, Crepitus, Excoriation, Induration, Rash, Scarring, Dry/Scaly, Maceration, Atrophie Blanche, Cyanosis, Ecchymosis, Mottled, Pallor, Rubor, Erythema. Periwound temperature was noted as No Abnormality. 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 ([id number]) Wound Cleansing: Wound #1 Right,Medial Lower Leg: Cleanse wound with mild soap and water Wound #2 Right,Lateral Lower Leg: Cleanse wound with mild soap and water Anesthetic (add to Medication List): Wound #1 Right,Medial Lower Leg: Topical Lidocaine 4% cream applied to wound bed prior to debridement (In Clinic Only). Wound #2 Right,Lateral Lower Leg: Topical Lidocaine 4% cream applied to wound bed prior to debridement (In Clinic Only). Skin Barriers/Peri-Wound Care: Wound #1 Right,Medial Lower Leg: Triamcinolone Acetonide Ointment (TCA) Wound #2 Right,Lateral Lower Leg: Triamcinolone Acetonide Ointment (TCA) Primary Wound Dressing: Wound #1 Right,Medial Lower Leg: Silver Alginate - on draining areas-bilateral lower extremities Wound #2 Right,Lateral Lower Leg: Silver Alginate - on draining areas-bilateral lower  extremities Secondary Dressing: Wound #1 Right,Medial Lower Leg: XtraSorb Wound #2 Right,Lateral Lower Leg: XtraSorb Dressing Change Frequency: Wound #1 Right,Medial Lower Leg: Change dressing every week Wound #2 Right,Lateral Lower Leg: Change dressing every week Follow-up Appointments: Wound #1 Right,Medial Lower Leg: Return Appointment in 1 week. Nurse Visit as needed Wound #2 Right,Lateral Lower Leg: Return Appointment in 1 week. Nurse Visit as needed Edema Control: Wound #1 Right,Medial Lower Leg: 3 Layer Compression System - Bilateral Wound #2 Right,Lateral Lower Leg: 3 Layer Compression System - Bilateral General Notes: Patient to order compression stockings from  [facility]. (R) 48.3 calf 27.2 ankle Heel to Knee 44 (L) 45 calf, 26 ankle, 44 heel to knee 1. We continue with silver alginate to all wound areas 2. TCA and moisturizing cream 3. 3 layer compression bilaterally. The increase in compression should help close down these areas. She could tolerate 4 layer compression if necessary 4. She will need 20-30 below-knee stockings. We talked about skin lubrication Electronic Signature(s) Sierra ChancyWETHERELL, Remonia M. ([id number]) Signed: 08/08/2018 6:19:28 PM By: Elliot GurneyWoody, BSN, RN, CWS, Kim RN, BSN Signed: 08/08/2018 6:19:51 PM By: Baltazar Najjarobson, Prisha Hiley MD Previous Signature: 08/08/2018 6:19:03 PM Version By: Baltazar Najjarobson, Opal Mckellips MD Entered By: Elliot GurneyWoody, BSN, RN, CWS, Kim on 08/08/2018 18:19:28 Sierra ChancyWETHERELL, Braylea M. ([id number]) -------------------------------------------------------------------------------- SuperBill Details Patient Name: Sierra ChancyWETHERELL, Rozelle M. Date of Service: 08/08/2018 Medical Record Number: [id number] Patient Account Number: [id number] Date of Birth/Sex: Sep 25, 1940 (78 y.o. 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

## 2018-08-13 NOTE — Progress Notes (Signed)
Sierra ChancyWETHERELL, Aleria M. ([id number]) Visit Report for 08/08/2018 Arrival Information Details Patient Name: Sierra ChancyWETHERELL, [name] Date of Service: 08/08/2018 2:15 PM Medical Record Number: [id number] Patient Account Number: [id number] Date of Birth/Sex: [birth year] (77 y.o. F) Treating RN: Arnette NorrisBiell, Kristina Primary Care Britanni Yarde: [name], [name] Other Clinician: Referring Arielle Eber: [name], [name] Treating Reegan Bouffard/Extender: [name], [name] Weeks in Treatment: 2 Visit Information History Since Last Visit Added or deleted any medications: No Patient Arrived: Wheel Chair Any new allergies or adverse reactions: No Arrival Time: 14:17 Had a fall or experienced change in No Accompanied By: self activities of daily living that may affect Transfer Assistance: None risk of falls: Patient Identification Verified: Yes Signs or symptoms of abuse/neglect since last visito No Secondary Verification Process Yes Hospitalized since last visit: No Completed: Has Dressing in Place as Prescribed: Yes Patient Has Alerts: Yes Has Compression in Place as Prescribed: Yes Patient Alerts: ABI [place] BILATERAL Pain Present Now: No >220 Electronic Signature(s) Signed: 08/13/2018 9:36:27 AM By: Arnette NorrisBiell, Kristina Entered By: Arnette NorrisBiell, Kristina on 08/08/2018 14:17:58 Sierra ChancyWETHERELL, [name] ([id number]) -------------------------------------------------------------------------------- Encounter Discharge Information Details Patient Name: Sierra ChancyWETHERELL, [name] Date of Service: 08/08/2018 2:15 PM Medical Record Number: [id number] Patient Account Number: [id number] Date of Birth/Sex: [birth year] (77 y.o. F) Treating RN: Rodell PernaScott, Dajea Primary Care Aamir Mclinden: [name], [name] Other Clinician: Referring Datrell Dunton: [name], [name] Treating Emalyn Schou/Extender: [name], [name] Weeks in Treatment: 2 Encounter Discharge Information Items Discharge Condition: Stable Ambulatory Status: Wheelchair Discharge Destination:  Home Transportation: Private Auto Accompanied By: self Schedule Follow-up Appointment: Yes Clinical Summary of Care: Electronic Signature(s) Signed: 08/13/2018 8:46:05 AM By: Rodell PernaScott, Dajea Entered By: Rodell PernaScott, Dajea on 08/08/2018 15:29:55 Sierra ChancyWETHERELL, Airi M. ([id number]) -------------------------------------------------------------------------------- Lower Extremity Assessment Details Patient Name: Sierra ChancyWETHERELL, [name] Date of Service: 08/08/2018 2:15 PM Medical Record Number: [id number] Patient Account Number: [id number] Date of Birth/Sex: [birth year] (77 y.o. F) Treating RN: Arnette NorrisBiell, Kristina Primary Care Beronica Lansdale: [name], [name] Other Clinician: Referring Katerra Ingman: [name], [name] Treating Sekou Zuckerman/Extender: [name], [name] Weeks in Treatment: 2 Edema Assessment Assessed: [Left: No] [Right: No] [Left: Edema] [Right: :] Calf Left: Right: Point of Measurement: 32 cm From Medial Instep 45 cm 48.3 cm Ankle Left: Right: Point of Measurement: 12 cm From Medial Instep 26 cm 27.2 cm Vascular Assessment Pulses: Dorsalis Pedis Palpable: [Left:Yes] [Right:Yes] Posterior Tibial Palpable: [Left:Yes] [Right:Yes] Extremity colors, hair growth, and conditions: Extremity Color: [Left:Red] [Right:Red] Hair Growth on Extremity: [Left:No] [Right:No] Temperature of Extremity: [Left:Warm] [Right:Warm] Capillary Refill: [Left:< 3 seconds] [Right:< 3 seconds] Toe Nail Assessment Left: Right: Thick: Yes Yes Discolored: Yes Yes Deformed: Yes Yes Improper Length and Hygiene: Yes Yes Electronic Signature(s) Signed: 08/13/2018 9:36:27 AM By: Arnette NorrisBiell, Kristina Entered By: Arnette NorrisBiell, Kristina on 08/08/2018 14:37:52 Sierra ChancyWETHERELL, [name] ([id number]) -------------------------------------------------------------------------------- Multi Wound Chart Details Patient Name: Sierra ChancyWETHERELL, [name] Date of Service: 08/08/2018 2:15 PM Medical Record Number: [id number] Patient Account Number:  [id number] Date of Birth/Sex: [birth year] (77 y.o. F) Treating RN: Huel CoventryWoody, Kim Primary Care Markita Stcharles: [name], [name] Other Clinician: Referring Glendola Friedhoff: [name], [name] Treating Peggy Monk/Extender: [name], [name] Weeks in Treatment: 2 Vital Signs Height(in): 66 Pulse(bpm): 95 Weight(lbs): 208 Blood Pressure(mmHg): 161/73 Body Mass Index(BMI): 34 Temperature(F): 97.9 Respiratory Rate 18 (breaths/min): Photos: [1:No Photos] [2:No Photos] [N/A:N/A] Wound Location: [1:Right Lower Leg - Medial] [2:Right Lower Leg - Lateral] [N/A:N/A] Wounding Event: [1:Gradually Appeared] [2:Gradually Appeared] [N/A:N/A] Primary Etiology: [1:Lymphedema] [2:Lymphedema] [N/A:N/A] Comorbid History: [1:Anemia] [2:Anemia] [N/A:N/A] Date Acquired: [1:07/24/2018] [2:07/24/2018] [N/A:N/A] Weeks of Treatment: [1:1] [2:1] [N/A:N/A] Wound Status: [1:Open] [2:Open] [N/A:N/A] Measurements L x W x D [1:0.1x0.1x0.1] [2:1.5x0.5x0.1] [  N/A:N/A] (cm) Area (cm) : [1:0.008] [2:0.589] [N/A:N/A] Volume (cm) : [1:0.001] [2:0.059] [N/A:N/A] % Reduction in Area: [1:100.00%] [2:96.90%] [N/A:N/A] % Reduction in Volume: [1:100.00%] [2:96.90%] [N/A:N/A] Classification: [1:Partial Thickness] [2:Partial Thickness] [N/A:N/A] Exudate Amount: [1:Large] [2:Large] [N/A:N/A] Exudate Type: [1:Serous] [2:Serous] [N/A:N/A] Exudate Color: [1:amber] [2:amber] [N/A:N/A] Wound Margin: [1:Indistinct, nonvisible] [2:Indistinct, nonvisible] [N/A:N/A] Granulation Amount: [1:None Present (0%)] [2:None Present (0%)] [N/A:N/A] Necrotic Amount: [1:None Present (0%)] [2:None Present (0%)] [N/A:N/A] Exposed Structures: [1:Fascia: No Fat Layer (Subcutaneous Tissue) Exposed: No Tendon: No Muscle: No Joint: No Bone: No Limited to Skin Breakdown] [2:Fascia: No Fat Layer (Subcutaneous Tissue) Exposed: No Tendon: No Muscle: No Joint: No Bone: No Limited to Skin Breakdown]  [N/A:N/A] Epithelialization: [1:Large (67-100%)] [2:Large (67-100%)]  [N/A:N/A] Periwound Skin Texture: [1:Excoriation: No Induration: No Callus: No Crepitus: No Rash: No Scarring: No] [2:Excoriation: No Induration: No Callus: No Crepitus: No Rash: No Scarring: No] [N/A:N/A] Periwound Skin Moisture: [N/A:N/A] Maceration: No Maceration: No Dry/Scaly: No Dry/Scaly: No Periwound Skin Color: Hemosiderin Staining: Yes Hemosiderin Staining: Yes N/A Atrophie Blanche: No Atrophie Blanche: No Cyanosis: No Cyanosis: No Ecchymosis: No Ecchymosis: No Erythema: No Erythema: No Mottled: No Mottled: No Pallor: No Pallor: No Rubor: No Rubor: No Temperature: No Abnormality No Abnormality N/A Tenderness on Palpation: Yes No N/A Wound Preparation: Ulcer Cleansing: Other: soap Ulcer Cleansing: Other: soap N/A and water and water Topical Anesthetic Applied: Topical Anesthetic Applied: None None Treatment Notes Electronic Signature(s) Signed: 08/08/2018 6:19:03 PM By: Baltazar Najjarobson, Michael MD Entered By: Baltazar Najjarobson, Michael on 08/08/2018 15:24:15 Sierra ChancyWETHERELL, Seylah M. ([id number]) -------------------------------------------------------------------------------- Multi-Disciplinary Care Plan Details Patient Name: Sierra ChancyWETHERELL, Tayla M. Date of Service: 08/08/2018 2:15 PM Medical Record Number: [id number] Patient Account Number: [id number] Date of Birth/Sex: 08-16-1940 (77 y.o. F) Treating RN: Huel CoventryWoody, Kim Primary Care Jaquaya Coyle: [name], [name] Other Clinician: Referring Nykolas Bacallao: [name], [name] Treating Brehanna Deveny/Extender: [name], [name] Weeks in Treatment: 2 Active Inactive Orientation to the Wound Care Program Nursing Diagnoses: Knowledge deficit related to the wound healing center program Goals: Patient/caregiver will verbalize understanding of the Wound Healing Center Program Date Initiated: 07/25/2018 Target Resolution Date: 08/25/2018 Goal Status: Active Interventions: Provide education on orientation to the wound center Notes: Soft Tissue  Infection Nursing Diagnoses: Impaired tissue integrity Goals: Patient's soft tissue infection will resolve Date Initiated: 07/25/2018 Target Resolution Date: 08/25/2018 Goal Status: Active Interventions: Assess signs and symptoms of infection every visit Treatment Activities: Systemic antibiotics : 07/25/2018 Notes: Venous Leg Ulcer Nursing Diagnoses: Potential for venous Insuffiency (use before diagnosis confirmed) Goals: Patient will maintain optimal edema control Date Initiated: 07/25/2018 Target Resolution Date: 08/25/2018 Goal Status: Active Sierra ChancyWETHERELL, Yanna M. ([id number]) Interventions: Assess peripheral edema status every visit. Provide education on venous insufficiency Notes: Wound/Skin Impairment Nursing Diagnoses: Impaired tissue integrity Knowledge deficit related to smoking impact on wound healing Goals: Ulcer/skin breakdown will have a volume reduction of 30% by week 4 Date Initiated: 07/25/2018 Target Resolution Date: 08/25/2018 Goal Status: Active Interventions: Assess ulceration(s) every visit Treatment Activities: Topical wound management initiated : 07/25/2018 Notes: Electronic Signature(s) Signed: 08/09/2018 8:43:41 AM By: Elliot GurneyWoody, BSN, RN, CWS, Kim RN, BSN Entered By: Elliot GurneyWoody, BSN, RN, CWS, Kim on 08/08/2018 14:51:36 Sierra ChancyWETHERELL, Kadynce M. ([id number]) -------------------------------------------------------------------------------- Non-Wound Condition Assessment Details Patient Name: Sierra ChancyWETHERELL, Charmaine M. Date of Service: 08/08/2018 2:15 PM Medical Record Number: [id number] Patient Account Number: [id number] Date of Birth/Sex: 08-16-1940 (77 y.o. F) Treating RN: Arnette NorrisBiell, Kristina Primary Care Bradrick Kamau: [name], [name] Other Clinician: Referring Joenathan Sakuma: [name], [name] Treating Elad Macphail/Extender: [name], [name] Weeks in Treatment: 2 Non-Wound Condition: Condition: Lymphedema Location: Leg Side:  Bilateral Photos Periwound Skin Texture Texture  Color No Abnormalities Noted: No No Abnormalities Noted: No Callus: No Atrophie Blanche: No Crepitus: No Cyanosis: No Excoriation: No Ecchymosis: No Friable: No Erythema: Yes Induration: No Hemosiderin Staining: Yes Rash: No Mottled: No Scarring: No Pallor: No Rubor: No Moisture No Abnormalities Noted: No Temperature / Pain Dry / Scaly: No Temperature: No Abnormality Maceration: No Tenderness on Palpation: Yes Electronic Signature(s) Signed: 08/09/2018 10:13:39 AM By: Arnette Norris Entered By: Arnette Norris on 08/09/2018 10:13:39 Sierra [name] ([id number]) -------------------------------------------------------------------------------- Pain Assessment Details Patient Name: Sierra [name]. Date of Service: 08/08/2018 2:15 PM Medical Record Number: [id number] Patient Account Number: [id number] Date of Birth/Sex: 1941-05-31 (77 y.o. F) Treating RN: Arnette Norris Primary Care Telia Amundson: Lucy Chris Other Clinician: Referring Aliene Tamura: Lucy Chris Treating Ermal Brzozowski/Extender: Altamese Mier in Treatment: 2 Active Problems Location of Pain Severity and Description of Pain Patient Has Paino No Site Locations Pain Management and Medication Current Pain Management: Electronic Signature(s) Signed: 08/13/2018 9:36:27 AM By: Arnette Norris Entered By: Arnette Norris on 08/08/2018 14:18:07 Sierra [name] ([id number]) -------------------------------------------------------------------------------- Patient/Caregiver Education Details Patient Name: Sierra [name]. Date of Service: 08/08/2018 2:15 PM Medical Record Number: [id number] Patient Account Number: [id number] Date of Birth/Gender: May 21, 1941 (78 y.o. F) Treating RN: Rodell Perna Primary Care Physician: Lucy Chris Other Clinician: Referring Physician: Lucy Chris Treating Physician/Extender: Altamese  in Treatment: 2 Education  Assessment Education Provided To: Patient Education Topics Provided Venous: Handouts: Controlling Swelling with Compression Stockings , Controlling Swelling with Multilayered Compression Wraps Methods: Demonstration, Explain/Verbal Electronic Signature(s) Signed: 08/13/2018 8:46:05 AM By: Rodell Perna Entered By: Rodell Perna on 08/08/2018 15:30:24 Sierra [name] ([id number]) -------------------------------------------------------------------------------- Wound Assessment Details Patient Name: Sierra [name]. Date of Service: 08/08/2018 2:15 PM Medical Record Number: [id number] Patient Account Number: [id number] Date of Birth/Sex: [birth year] (77 y.o. F) Treating RN: Arnette Norris Primary Care Cledith Kamiya: Lucy Chris Other Clinician: Referring Traci Plemons: Lucy Chris Treating Lambert Jeanty/Extender: Maxwell Caul Weeks in Treatment: 2 Wound Status Wound Number: 1 Primary Etiology: Lymphedema Wound Location: Left Lower Leg - Medial Wound Status: Open Wounding Event: Gradually Appeared Comorbid History: Anemia Date Acquired: 07/24/2018 Weeks Of Treatment: 1 Clustered Wound: No Photos Photo Uploaded By: Arnette Norris on 08/09/2018 10:02:48 Wound Measurements Length: (cm) 0.1 Width: (cm) 0.1 Depth: (cm) 0.1 Area: (cm) 0.008 Volume: (cm) 0.001 % Reduction in Area: 100% % Reduction in Volume: 100% Epithelialization: Large (67-100%) Wound Description Classification: Partial Thickness Wound Margin: Indistinct, nonvisible Exudate Amount: Large Exudate Type: Serous Exudate Color: amber Foul Odor After Cleansing: No Slough/Fibrino No Wound Bed Granulation Amount: None Present (0%) Exposed Structure Necrotic Amount: None Present (0%) Fascia Exposed: No Fat Layer (Subcutaneous Tissue) Exposed: No Tendon Exposed: No Muscle Exposed: No Joint Exposed: No Bone Exposed: No Limited to Skin Breakdown Periwound Skin Texture KERRILYN, WILLDEN.  ([id number]) Texture Color No Abnormalities Noted: No No Abnormalities Noted: No Callus: No Atrophie Blanche: No Crepitus: No Cyanosis: No Excoriation: No Ecchymosis: No Induration: No Erythema: No Rash: No Hemosiderin Staining: Yes Scarring: No Mottled: No Pallor: No Moisture Rubor: No No Abnormalities Noted: No Dry / Scaly: No Temperature / Pain Maceration: No Temperature: No Abnormality Tenderness on Palpation: Yes Wound Preparation Ulcer Cleansing: Other: soap and water, Topical Anesthetic Applied: None Treatment Notes Wound #1 (Right, Medial Lower Leg) 1. Cleansed with: Clean wound with Normal Saline 2. Anesthetic Topical Lidocaine 4% cream to wound bed prior to debridement 4. Dressing Applied: Other dressing (specify in notes) 5. Secondary Dressing Applied  ABD Pad 7. Secured with Tape 3 Layer Compression System - Bilateral Notes Silver Cell Electronic Signature(s) Signed: 08/08/2018 6:18:54 PM By: Elliot Gurney, BSN, RN, CWS, Kim RN, BSN Signed: 08/13/2018 9:36:27 AM By: Arnette Norris Entered By: Elliot Gurney, BSN, RN, CWS, Kim on 08/08/2018 18:18:54 Sierra [name] ([id number]) -------------------------------------------------------------------------------- Wound Assessment Details Patient Name: DALYNN, STEUERWALD. Date of Service: 08/08/2018 2:15 PM Medical Record Number: [id number] Patient Account Number: [id number] Date of Birth/Sex: [birth year] (77 y.o. F) Treating RN: Arnette Norris Primary Care Marcheta Horsey: Lucy Chris Other Clinician: Referring [place] Fassnacht: Lucy Chris Treating Calypso Hagarty/Extender: Maxwell Caul Weeks in Treatment: 2 Wound Status Wound Number: 2 Primary Etiology: Lymphedema Wound Location: Right Lower Leg - Lateral Wound Status: Open Wounding Event: Gradually Appeared Comorbid History: Anemia Date Acquired: 07/24/2018 Weeks Of Treatment: 1 Clustered Wound: No Photos Photo Uploaded By: Arnette Norris on 08/09/2018  10:02:49 Wound Measurements Length: (cm) 1.5 Width: (cm) 0.5 Depth: (cm) 0.1 Area: (cm) 0.589 Volume: (cm) 0.059 % Reduction in Area: 96.9% % Reduction in Volume: 96.9% Epithelialization: Large (67-100%) Tunneling: No Undermining: No Wound Description Classification: Partial Thickness Wound Margin: Indistinct, nonvisible Exudate Amount: Large Exudate Type: Serous Exudate Color: amber Foul Odor After Cleansing: No Slough/Fibrino No Wound Bed Granulation Amount: None Present (0%) Exposed Structure Necrotic Amount: None Present (0%) Fascia Exposed: No Fat Layer (Subcutaneous Tissue) Exposed: No Tendon Exposed: No Muscle Exposed: No Joint Exposed: No Bone Exposed: No Limited to Skin Breakdown Periwound Skin Texture SHALANDA, RIFKIN. ([id number]) Texture Color No Abnormalities Noted: No No Abnormalities Noted: No Callus: No Atrophie Blanche: No Crepitus: No Cyanosis: No Excoriation: No Ecchymosis: No Induration: No Erythema: No Rash: No Hemosiderin Staining: Yes Scarring: No Mottled: No Pallor: No Moisture Rubor: No No Abnormalities Noted: No Dry / Scaly: No Temperature / Pain Maceration: No Temperature: No Abnormality Wound Preparation Ulcer Cleansing: Other: soap and water, Topical Anesthetic Applied: None Treatment Notes Wound #2 (Right, Lateral Lower Leg) 1. Cleansed with: Clean wound with Normal Saline 2. Anesthetic Topical Lidocaine 4% cream to wound bed prior to debridement 4. Dressing Applied: Other dressing (specify in notes) 5. Secondary Dressing Applied ABD Pad 7. Secured with Tape 3 Layer Compression System - Bilateral Notes Silver Cell Electronic Signature(s) Signed: 08/13/2018 9:36:27 AM By: Arnette Norris Entered By: Arnette Norris on 08/08/2018 14:35:27 Sierra [name] ([id number]) -------------------------------------------------------------------------------- Vitals Details Patient Name: Sierra [name]. Date of Service: 08/08/2018 2:15 PM Medical Record Number: [id number] Patient Account Number: [id number] Date of Birth/Sex: Apr 18, 1941 (77 y.o. F) Treating RN: Arnette Norris Primary Care Kamilia Carollo: Lucy Chris Other Clinician: Referring Turrell Severt: Lucy Chris Treating Ralyn Stlaurent/Extender: Altamese [place] in Treatment: 2 Vital Signs Time Taken: 14:18 Temperature (F): 97.9 Height (in): 66 Pulse (bpm): 95 Source: Stated Respiratory Rate (breaths/min): 18 Weight (lbs): 208 Blood Pressure (mmHg): 161/73 Source: Stated Reference Range: 80 - 120 mg / dl Body Mass Index (BMI): 33.6 Airway Electronic Signature(s) Signed: 08/13/2018 9:36:27 AM By: Arnette Norris Entered By: Arnette Norris on 08/08/2018 14:29:07

## 2018-08-15 ENCOUNTER — Encounter: Payer: Medicare Other | Admitting: Internal Medicine

## 2018-08-15 DIAGNOSIS — I87333 Chronic venous hypertension (idiopathic) with ulcer and inflammation of bilateral lower extremity: Secondary | ICD-10-CM | POA: Diagnosis not present

## 2018-08-16 NOTE — Progress Notes (Signed)
[name], [name] ([id number]) Visit Report for 08/15/2018 HPI Details Patient Name: Sierra [name], Sierra [name]. Date of Service: 08/15/2018 1:15 PM Medical Record Number: [id number] Patient Account Number: [id number] Date of Birth/Sex: 1941-04-17 (77 y.o. F) Treating RN: Huel Coventry Primary Care Provider: Lucy Chris Other Clinician: Referring Provider: Lucy Chris Treating Provider/Extender: Altamese [place] in Treatment: 3 History of Present Illness HPI Description: Admission 07/25/2018 Patient is a 78 year old woman who lives alone in [place]. She was referred here after recently establishing primary care. I get the sense that she has been ignoring medical care for a number of years. Her appointment was sometime last week. It was notable that she had erythema and tenderness predominantly in her left posterior greater than right posterior calf and an antibiotic was prescribed although she still has not picked this up. She plans to do this later today. We do not have records from her primary doctor and I do not know exactly what antibiotic. The history she provides is extremely vague. She talks about increased swelling and erythema in her legs in the last several weeks to 2 months although I suspect it is been there longer than that. She walks with a walker. The patient has weeping areas in the right posterior calf but the most impressive area is a large area in the left posterior calf. 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 She is complaining of pain in her posterior calves bilaterally. Past medical history; there is nothing on her in [place] link. 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 The patient has lost some weight but she states she has been trying to lose some weight. She has quit smoking in 1994. There is no records of her being a diabetic ABIs were noncompressible bilaterally 2/5;. The patient did not want home health in her home. I am not exactly sure what the issue was. She has large areas on the left greater than right posterior calf in the setting of chronic venous inflammation with secondary lymphedema. Her wounds are about the same although her edema control is better. She has her arterial studies on 2/7 2/12; patient's arterial studies were actually quite good with an ABI of 1.28 on the right with triphasic waveforms and 1.31 on the left also with triphasic waveforms. Not felt to have significant arterial disease. The patient has smaller areas on the posterior right calf draining area on the left anterior tibial area and the left lateral calf. And flaky. 2/19; patient's arterial studies were quite normal last week so we increased her to 3 layer compression which she tolerated fairly well. She comes in with the dressing very wet which she claims are from weeping of the wounds. She has 2 new wounds on the upper left lateral tibial area. Electronic Signature(s) Signed: 08/15/2018 6:18:32 PM By: Baltazar Najjar MD Entered By: Baltazar Najjar on 08/15/2018 16:17:59 Sierra [name] ([id number]) -------------------------------------------------------------------------------- Physical Exam Details Patient Name: Sierra [name]. Date of Service: 08/15/2018 1:15 PM Medical Record Number: [id number] Patient Account Number: [id number] Date of Birth/Sex: [birth year] (77 y.o. F) Treating RN: Huel Coventry Primary Care Provider: Lucy Chris Other Clinician: Referring Provider: Lucy Chris Treating Provider/Extender: Altamese [place] in  Treatment: 3 Constitutional Patient is  hypertensive.. Pulse regular and within target range for patient.Marland Kitchen. Respirations regular, non-labored and within target range.. Temperature is normal and within the target range for the patient.Marland Kitchen. appears in no distress. Eyes Conjunctivae clear. No discharge. Respiratory Respiratory effort is easy and symmetric bilaterally. Rate is normal at rest and on room air.. Cardiovascular Pedal pulses are palpable. Edema control is adequate. Lymphatic None palpable in the popliteal area bilaterally. Integumentary (Hair, Skin) Dry flaking skin on her lower extremities with venous inflammation.Marland Kitchen. Psychiatric No evidence of depression, anxiety, or agitation. Calm, cooperative, and communicative. Appropriate interactions and affect.. Notes Wound exam; the area in question on the left anterior just below the tibial process tuberosity and on the left posterior calf. I did not see anything open on the right leg. Dry flaking irritated skin with some uncontrollable edema. She has her stocking Electronic Signature(s) Signed: 08/15/2018 6:18:32 PM By: Baltazar Najjarobson, Kourtney Terriquez MD Entered By: Baltazar Najjarobson, Anjannette Gauger on 08/15/2018 16:23:33 Sierra [name], Caelie M. ([id number]) -------------------------------------------------------------------------------- Physician Orders Details Patient Name: Sierra [name], [name] Date of Service: 08/15/2018 1:15 PM Medical Record Number: [id number] Patient Account Number: [id number] Date of Birth/Sex: Jul 08, 1940 (77 y.o. F) Treating RN: Huel CoventryWoody, Kim Primary Care Provider: [name], [name] Other Clinician: Referring Provider: [name], [name] Treating Provider/Extender: [name], Moody Robben G Weeks in Treatment: 3 Verbal / Phone Orders: No Diagnosis Coding Wound Cleansing Wound #2 Right,Lateral Lower Leg o Cleanse wound with mild soap and water Wound #3 Left,Lateral Lower Leg o Cleanse wound with mild soap and water Anesthetic (add to Medication List) Wound #2 Right,Lateral Lower Leg o  Topical Lidocaine 4% cream applied to wound bed prior to debridement (In Clinic Only). Wound #3 Left,Lateral Lower Leg o Topical Lidocaine 4% cream applied to wound bed prior to debridement (In Clinic Only). Skin Barriers/Peri-Wound Care Wound #2 Right,Lateral Lower Leg o Triamcinolone Acetonide Ointment (TCA) Wound #3 Left,Lateral Lower Leg o Triamcinolone Acetonide Ointment (TCA) Primary Wound Dressing Wound #2 Right,Lateral Lower Leg o Silver Alginate - on draining areas-bilateral lower extremities Wound #3 Left,Lateral Lower Leg o Silver Alginate - on draining areas-bilateral lower extremities Secondary Dressing Wound #2 Right,Lateral Lower Leg o XtraSorb Wound #3 Left,Lateral Lower Leg o XtraSorb Dressing Change Frequency Wound #2 Right,Lateral Lower Leg o Change dressing every week Wound #3 Left,Lateral Lower Leg o Change dressing every week Follow-up Appointments Sierra [name], Deniqua M. ([id number]) Wound #2 Right,Lateral Lower Leg o Return Appointment in 1 week. o Nurse Visit as needed Wound #3 Left,Lateral Lower Leg o Return Appointment in 1 week. o Nurse Visit as needed Edema Control Wound #2 Right,Lateral Lower Leg o 4-Layer Compression System - Left Lower Extremity. o Patient to wear own compression stockings - right Wound #3 Left,Lateral Lower Leg o 4-Layer Compression System - Left Lower Extremity. o Patient to wear own compression stockings - right Electronic Signature(s) Signed: 08/15/2018 6:18:32 PM By: Baltazar Najjarobson, Sevon Rotert MD Signed: 08/15/2018 6:42:35 PM By: Elliot GurneyWoody, BSN, RN, CWS, Kim RN, BSN Entered By: Elliot GurneyWoody, BSN, RN, CWS, Kim on 08/15/2018 14:37:20 Sierra [name], Kaliah M. ([id number]) -------------------------------------------------------------------------------- Problem List Details Patient Name: Sierra [name], Evoleht M. Date of Service: 08/15/2018 1:15 PM Medical Record Number: [id number] Patient Account Number:  [id number] Date of Birth/Sex: Jul 08, 1940 (77 y.o. 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 07/25/2018 No Yes skin  W09.811 Chronic venous hypertension (idiopathic) with ulcer and 07/25/2018 No Yes inflammation of bilateral lower extremity Inactive Problems Resolved Problems ICD-10 Code Description Active Date Resolved Date L97.211 Non-pressure chronic ulcer of right calf limited to breakdown of skin 07/25/2018 07/25/2018 L03.116 Cellulitis of left lower limb 07/25/2018 07/25/2018 Electronic Signature(s) Signed: 08/15/2018 6:18:32 PM By: Baltazar Najjar MD Entered By: Baltazar Najjar on 08/15/2018 16:16:18 Sierra [name] ([id number]) -------------------------------------------------------------------------------- Progress Note Details Patient Name: Sierra [name]. Date of Service: 08/15/2018 1:15 PM Medical Record Number: [id number] Patient Account Number: [id number] Date of Birth/Sex: 1940/12/12 (77 y.o. F) Treating RN: Huel Coventry Primary Care Provider: Lucy Chris Other Clinician: Referring Provider: Lucy Chris Treating Provider/Extender: Altamese [place] in Treatment: 3 Subjective History of Present Illness (HPI) Admission 07/25/2018 Patient is a 78 year old woman who lives alone in [place]. She was referred here after recently establishing primary care. I get the sense that she has been ignoring medical care for a number of years. Her appointment was sometime last week. It was notable that she had erythema and tenderness predominantly in her left posterior greater than right posterior calf and an antibiotic was prescribed although she  still has not picked this up. She plans to do this later today. We do not have records from her primary doctor and I do not know exactly what antibiotic. The history she provides is extremely vague. She talks about increased swelling and erythema in her legs in the last several weeks to 2 months although I suspect it is been there longer than that. She walks with a walker. The patient has weeping areas in the right posterior calf but the most impressive area is a large area in the left posterior calf. 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 She is complaining of pain in her posterior calves bilaterally. Past medical history; there is nothing on her in [place] link. 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 The patient has lost some weight but she states she has been trying to lose some weight. She has quit smoking in 1994. There is no records of her being a diabetic ABIs were noncompressible bilaterally 2/5;. The patient did not want home health in her home. I am not exactly sure what the issue was. She has large areas on the left greater than right posterior calf in the setting of chronic venous inflammation with secondary lymphedema. Her wounds are about the same although her edema control is better. She has her arterial studies on 2/7 2/12; patient's arterial studies were actually quite good with an ABI of 1.28 on the right with triphasic waveforms and 1.31 on the left also with triphasic waveforms. Not felt to have significant arterial disease. The patient has smaller areas on the posterior right calf draining area on the left anterior tibial area and the left  lateral calf. And flaky. 2/19; patient's arterial studies were quite normal last week so we increased her to 3 layer compression which she tolerated fairly well. She comes in with the dressing very wet which she claims are from weeping of the wounds. She has 2 new wounds on the upper left lateral tibial area. Objective Constitutional Patient is hypertensive.. Pulse regular and  within target range for patient.Marland Kitchen Respirations regular, non-labored and within target range.. Temperature is normal and within the target range for the patient.Marland Kitchen appears in no distress. MARIYANNA, MUCHA M. ([id number]) Vitals Time Taken: 1:18 PM, Height: 66 in, Weight: 208 lbs, BMI: 33.6, Temperature: 97.6 F, Pulse: 94 bpm, Respiratory Rate: 18 breaths/min, Blood Pressure: 183/80 mmHg. Eyes Conjunctivae clear. No discharge. Respiratory Respiratory effort is easy and symmetric bilaterally. Rate is normal at rest and on room air.. Cardiovascular Pedal pulses are palpable. Edema control is adequate. Lymphatic None palpable in the popliteal area bilaterally. Psychiatric No evidence of depression, anxiety, or agitation. Calm, cooperative, and communicative. Appropriate interactions and affect.. General Notes: Wound exam; the area in question on the left anterior just below the tibial process tuberosity and on the left posterior calf. I did not see anything open on the right leg. Dry flaking irritated skin with some uncontrollable edema. She has her stocking Integumentary (Hair, Skin) Dry flaking skin on her lower extremities with venous inflammation.. Wound #1 status is Healed - Epithelialized. Original cause of wound was Gradually Appeared. The wound is located on the Left,Medial Lower Leg. The wound measures 0cm length x 0cm width x 0cm depth; 0cm^2 area and 0cm^3 volume. The wound is limited to skin breakdown. There is no tunneling or undermining noted. There is a large amount of serous drainage noted. The wound  margin is indistinct and nonvisible. There is no granulation within the wound bed. There is no necrotic tissue within the wound bed. The periwound skin appearance exhibited: Hemosiderin Staining. The periwound skin appearance did not exhibit: Callus, Crepitus, Excoriation, Induration, Rash, Scarring, Dry/Scaly, Maceration, Atrophie Blanche, Cyanosis, Ecchymosis, Mottled, Pallor, Rubor, Erythema. Periwound temperature was noted as No Abnormality. The periwound has tenderness on palpation. Wound #2 status is Healed - Epithelialized. Original cause of wound was Gradually Appeared. The wound is located on the Right,Lateral Lower Leg. The wound measures 0cm length x 0cm width x 0cm depth; 0cm^2 area and 0cm^3 volume. The wound is limited to skin breakdown. There is no tunneling or undermining noted. There is a large amount of serous drainage noted. The wound margin is indistinct and nonvisible. There is no granulation within the wound bed. There is no necrotic tissue within the wound bed. The periwound skin appearance exhibited: Hemosiderin Staining. The periwound skin appearance did not exhibit: Callus, Crepitus, Excoriation, Induration, Rash, Scarring, Dry/Scaly, Maceration, Atrophie Blanche, Cyanosis, Ecchymosis, Mottled, Pallor, Rubor, Erythema. Periwound temperature was noted as No Abnormality. Wound #3 status is Open. Original cause of wound was Other Lesion. The wound is located on the Left,Lateral Lower Leg. The wound measures 3.3cm length x 1.7cm width x 0.1cm depth; 4.406cm^2 area and 0.441cm^3 volume. There is Fat Layer (Subcutaneous Tissue) Exposed exposed. There is no tunneling or undermining noted. There is a medium amount of serous drainage noted. Foul odor after cleansing was noted. The wound margin is distinct with the outline attached to the wound base. There is small (1-33%) red granulation within the wound bed. There is a small (1-33%) amount of necrotic tissue within the wound bed  including Adherent Slough. The periwound skin appearance exhibited: Dry/Scaly, Erythema. The periwound skin appearance did not exhibit: Callus, Crepitus, Excoriation, Induration, Rash, Scarring, Maceration, Atrophie Blanche, Cyanosis, Ecchymosis, Hemosiderin Staining, Mottled, Pallor, Rubor. The surrounding wound skin color is noted with erythema. Periwound temperature was noted as No Abnormality. The periwound has tenderness on palpation. Assessment ASHWIKA, FREELS. 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 Wound #3 Left,Lateral Lower Leg: Topical Lidocaine 4% cream applied to wound bed prior to debridement (In Clinic Only). Skin Barriers/Peri-Wound Care: Wound #2 Right,Lateral Lower Leg: Triamcinolone Acetonide Ointment (TCA) Wound #3 Left,Lateral Lower Leg: Triamcinolone Acetonide Ointment (TCA) Primary Wound Dressing: Wound #2 Right,Lateral Lower Leg: Silver Alginate - on draining areas-bilateral lower extremities Wound #3 Left,Lateral Lower Leg: Silver Alginate - on draining areas-bilateral lower extremities Secondary Dressing: Wound #2 Right,Lateral Lower Leg: XtraSorb Wound  #3 Left,Lateral Lower Leg: XtraSorb Dressing Change Frequency: Wound #2 Right,Lateral Lower Leg: Change dressing every week Wound #3 Left,Lateral Lower Leg: Change dressing every week Follow-up Appointments: Wound #2 Right,Lateral Lower Leg: Return Appointment in 1 week. Nurse Visit as needed Wound #3 Left,Lateral Lower Leg: Return Appointment in 1 week. Nurse Visit as needed Edema Control: Wound #2 Right,Lateral Lower Leg: MAURISHA, MARUNA. ([id number]) 4-Layer Compression System - Left Lower Extremity. Patient to wear own compression stockings - right Wound #3 Left,Lateral Lower Leg: 4-Layer Compression System - Left Lower Extremity. Patient to wear own compression stockings - right Silver alginate to open open areas on the left leg/extra sorb/triamcinolone/4-layer compression on the left. The patient is used to use her own stocking on the right leg and lubricate the skin. She arrived with the dressing on her left leg wet and malodorous. I am not sure what she is doing in her home. She would not allow home health to change this Electronic Signature(s) Signed: 08/15/2018 6:18:32 PM By: Baltazar Najjar MD Entered By: Baltazar Najjar on 08/15/2018 16:25:09 Sierra [name] ([id number]) -------------------------------------------------------------------------------- SuperBill Details Patient Name: Sierra [name]. Date of Service: 08/15/2018 Medical Record Number: [id number] Patient Account Number: [id number] Date of Birth/Sex: 06-14-1941 (78 y.o. F) Treating RN: Huel Coventry Primary Care Provider: Lucy Chris Other Clinician: Referring Provider: Lucy Chris Treating Provider/Extender: Altamese [place] in Treatment: 3 Diagnosis Coding ICD-10 Codes Code Description [phone] Non-pressure chronic ulcer of left calf limited to breakdown of skin L97.211 Non-pressure chronic ulcer of right calf limited to breakdown of skin I87.333 Chronic venous  hypertension (idiopathic) with ulcer and inflammation of bilateral lower extremity L03.116 Cellulitis of left lower limb Facility Procedures CPT4 Code: [id number] Description: (Facility Use Only) [phone] - APPLY MULTLAY COMPRS LWR LT LEG Modifier: Quantity: 1 Physician Procedures CPT4: Description Modifier Quantity Code [id number] - WC PHYS LEVEL 3 - EST PT 1 ICD-10 Diagnosis Description L97.221 Non-pressure chronic ulcer of left calf limited to breakdown of skin I87.333 Chronic venous hypertension (idiopathic) with ulcer and  inflammation of bilateral lower extremity Electronic Signature(s) Signed: 08/15/2018 6:18:32 PM By: Baltazar Najjar MD Entered By: Baltazar Najjar on 08/15/2018 16:25:32

## 2018-08-21 NOTE — Progress Notes (Signed)
[name], [name] ([id number]) Visit Report for 08/15/2018 Arrival Information Details Patient Name: Sierra [name], Sierra [name]. Date of Service: 08/15/2018 1:15 PM Medical Record Number: [id number] Patient Account Number: [id number] Date of Birth/Sex: [birth year] (78 y.o. F) Treating RN: Huel Coventry Primary Care Hugh Kamara: Lucy Chris Other Clinician: Referring Petrita Blunck: Lucy Chris Treating Lugene Hitt/Extender: Altamese [place] in Treatment: 3 Visit Information History Since Last Visit Added or deleted any medications: No Patient Arrived: Wheel Chair Any new allergies or adverse reactions: No Arrival Time: 13:17 Had a fall or experienced change in No Accompanied By: self activities of daily living that may affect Transfer Assistance: None risk of falls: Patient Identification Verified: Yes Signs or symptoms of abuse/neglect since last visito No Secondary Verification Process Yes Hospitalized since last visit: No Completed: Implantable device outside of the clinic excluding No Patient Has Alerts: Yes cellular tissue based products placed in the center Patient Alerts: ABI 08/03/2018 since last visit: [facility] Has Dressing in Place as Prescribed: Yes (R) 1.28 (L) 1.3 Pain Present Now: No Electronic Signature(s) Signed: 08/15/2018 6:25:26 PM By: Elliot Gurney, BSN, RN, CWS, Kim RN, BSN Previous Signature: 08/15/2018 2:49:55 PM Version By: Weyman Rodney, Sallie RCP, RRT, CHT Entered By: Elliot Gurney, BSN, RN, CWS, Kim on 08/15/2018 18:25:26 Sierra [name] ([id number]) -------------------------------------------------------------------------------- Compression Therapy Details Patient Name: Sierra [name]. Date of Service: 08/15/2018 1:15 PM Medical Record Number: [id number] Patient Account Number: [id number] Date of Birth/Sex: 03-11-1941 (78 y.o. F) Treating RN: Huel Coventry Primary Care Imri Lor: Lucy Chris Other Clinician: Referring Sy Saintjean: Lucy Chris Treating Matthews Franks/Extender: Altamese [place] in Treatment: 3 Compression Therapy Performed for Wound Assessment: Wound #3 Left,Lateral Lower Leg Performed By: Clinician Huel Coventry, RN Compression Type: Three Layer Pre Treatment ABI: 1.3 Post Procedure Diagnosis Same as Pre-procedure Electronic Signature(s) Signed: 08/15/2018 6:26:02 PM By: Elliot Gurney, BSN, RN, CWS, Kim RN, BSN Entered By: Elliot Gurney, BSN, RN, CWS, Kim on 08/15/2018 18:26:02 Sierra [name] ([id number]) -------------------------------------------------------------------------------- Encounter Discharge Information Details Patient Name: Sierra [name]. Date of Service: 08/15/2018 1:15 PM Medical Record Number: [id number] Patient Account Number: [id number] Date of Birth/Sex: 04-Dec-1940 (78 y.o. F) Treating RN: Arnette Norris Primary Care Insiya Oshea: Lucy Chris Other Clinician: Referring Trevonn Hallum: Lucy Chris Treating Creg Gilmer/Extender: Altamese [place] in Treatment: 3 Encounter Discharge Information Items Discharge Condition: Stable Ambulatory Status: Wheelchair Discharge Destination: Home Transportation: Private Auto Accompanied By: self Schedule Follow-up Appointment: Yes Clinical Summary of Care: Electronic Signature(s) Signed: 08/16/2018 8:42:51 AM By: Arnette Norris Entered By: Arnette Norris on 08/16/2018 08:42:51 Sierra [name] ([id number]) -------------------------------------------------------------------------------- Lower Extremity Assessment Details Patient Name: Sierra [name]. Date of Service: 08/15/2018 1:15 PM Medical Record Number: [id number] Patient Account Number: [id number] Date of Birth/Sex: 26-Nov-1940 (78 y.o. F) Treating RN: Rodell Perna Primary Care Latavion Halls: Lucy Chris Other Clinician: Referring Tequila Rottmann: Lucy Chris Treating Mervin Ramires/Extender: Altamese  in Treatment: 3 Edema Assessment Assessed: [Left: No]  [Right: No] Edema: [Left: Yes] [Right: Yes] Calf Left: Right: Point of Measurement: 32 cm From Medial Instep 48 cm 48 cm Ankle Left: Right: Point of Measurement: 12 cm From Medial Instep 26 cm 32 cm Vascular Assessment Pulses: Dorsalis Pedis Palpable: [Left:Yes] [Right:Yes] Posterior Tibial Extremity colors, hair growth, and conditions: Extremity Color: [Left:Red] [Right:Red] Hair Growth on Extremity: [Left:Yes] [Right:Yes] Temperature of Extremity: [Left:Warm] [Right:Warm] Capillary Refill: [Left:< 3 seconds] [Right:< 3 seconds] Toe Nail Assessment Left: Right: Thick: Yes Yes Discolored: Yes Yes Deformed: No No Improper Length Sierra [name], DEMELOlectronic Signature(s) Signed: 08/17/2018 4:09:31 PM By: Rodell Perna  Entered By: Rodell Perna on 08/15/2018 13:48:05 Sierra [name] ([id number]) -------------------------------------------------------------------------------- Multi Wound Chart Details Patient Name: Sierra [name]. Date of Service: 08/15/2018 1:15 PM Medical Record Number: [id number] Patient Account Number: [id number] Date of Birth/Sex: 1941/04/29 (78 y.o. F) Treating RN: Huel Coventry Primary Care Sanad Fearnow: Lucy Chris Other Clinician: Referring Miley Lindon: Lucy Chris Treating Oumou Smead/Extender: Altamese [place] in Treatment: 3 Vital Signs Height(in): 66 Pulse(bpm): 94 Weight(lbs): 208 Blood Pressure(mmHg): 183/80 Body Mass Index(BMI): 34 Temperature(F): 97.6 Respiratory Rate 18 (breaths/min): Photos: [1:No Photos] [2:No Photos] [3:No Photos] Wound Location: [1:Left, Medial Lower Leg] [2:Right Lower Leg - Lateral] [3:Left Lower Leg - Lateral] Wounding Event: [1:Gradually Appeared] [2:Gradually Appeared] [3:Other Lesion] Primary Etiology: [1:Lymphedema] [2:Lymphedema] [3:Venous Leg Ulcer] Comorbid History: [1:Anemia] [2:Anemia] [3:Anemia] Date Acquired: [1:07/24/2018] [2:07/24/2018] [3:08/09/2018] Weeks of Treatment: [1:2] [2:2]  [3:0] Wound Status: [1:Healed - Epithelialized] [2:Open] [3:Open] Measurements L x W x D [1:0x0x0] [2:1.5x0.5x0.1] [3:3.3x1.7x0.1] (cm) Area (cm) : [1:0] [2:0.589] [3:4.406] Volume (cm) : [1:0] [2:0.059] [3:0.441] % Reduction in Area: [1:100.00%] [2:96.90%] [3:0.00%] % Reduction in Volume: [1:100.00%] [2:96.90%] [3:0.00%] Classification: [1:Partial Thickness] [2:Partial Thickness] [3:Full Thickness Without Exposed Support Structures] Exudate Amount: [1:Large] [2:Large] [3:Medium] Exudate Type: [1:Serous] [2:Serous] [3:Serous] Exudate Color: [1:amber] [2:amber] [3:amber] Foul Odor After Cleansing: [1:No] [2:No] [3:Yes] Odor Anticipated Due to [1:N/A] [2:N/A] [3:No] Product Use: Wound Margin: [1:Indistinct, nonvisible] [2:Indistinct, nonvisible] [3:Distinct, outline attached] Granulation Amount: [1:None Present (0%)] [2:None Present (0%)] [3:Small (1-33%)] Granulation Quality: [1:N/A] [2:N/A] [3:Red] Necrotic Amount: [1:None Present (0%)] [2:None Present (0%)] [3:Small (1-33%)] Exposed Structures: [1:Fascia: No Fat Layer (Subcutaneous Tissue) Exposed: No Tendon: No Muscle: No Joint: No Bone: No Limited to Skin Breakdown] [2:Fascia: No Fat Layer (Subcutaneous Tissue) Exposed: No Tendon: No Muscle: No Joint: No Bone: No Limited to Skin Breakdown]  [3:Fat Layer (Subcutaneous Tissue) Exposed: Yes Fascia: No Tendon: No Muscle: No Joint: No Bone: No] Epithelialization: [1:Large (67-100%)] [2:Large (67-100%)] [3:None] Periwound Skin Texture: [1:Excoriation: No Induration: No] [2:Excoriation: No Induration: No] [3:Excoriation: No Induration: No] Callus: No Callus: No Callus: No Crepitus: No Crepitus: No Crepitus: No Rash: No Rash: No Rash: No Scarring: No Scarring: No Scarring: No Periwound Skin Moisture: Maceration: No Maceration: No Dry/Scaly: Yes Dry/Scaly: No Dry/Scaly: No Maceration: No Periwound Skin Color: Hemosiderin Staining: Yes Hemosiderin Staining: Yes Erythema:  Yes Atrophie Blanche: No Atrophie Blanche: No Atrophie Blanche: No Cyanosis: No Cyanosis: No Cyanosis: No Ecchymosis: No Ecchymosis: No Ecchymosis: No Erythema: No Erythema: No Hemosiderin Staining: No Mottled: No Mottled: No Mottled: No Pallor: No Pallor: No Pallor: No Rubor: No Rubor: No Rubor: No Temperature: No Abnormality No Abnormality No Abnormality Tenderness on Palpation: Yes No Yes Wound Preparation: Ulcer Cleansing: Other: soap Ulcer Cleansing: Other: soap Ulcer Cleansing: and water and water Rinsed/Irrigated with Saline, Other: soap and water Topical Anesthetic Applied: Topical Anesthetic Applied: None None Topical Anesthetic Applied: Other: lidocaine 4% Treatment Notes Electronic Signature(s) Signed: 08/15/2018 6:18:32 PM By: Baltazar Najjar MD Entered By: Baltazar Najjar on 08/15/2018 16:17:15 Sierra [name] ([id number]) -------------------------------------------------------------------------------- Multi-Disciplinary Care Plan Details Patient Name: Sierra [name]. Date of Service: 08/15/2018 1:15 PM Medical Record Number: [id number] Patient Account Number: [id number] Date of Birth/Sex: 1941/05/29 (78 y.o. F) Treating RN: Huel Coventry Primary Care Beva Remund: Lucy Chris Other Clinician: Referring Berthel Bagnall: Lucy Chris Treating Leianna Barga/Extender: Altamese [place] in Treatment: 3 Active Inactive Orientation to the Wound Care Program Nursing Diagnoses: Knowledge deficit related to the wound healing center program Goals: Patient/caregiver will verbalize understanding of the Wound Healing Center Program Date Initiated: 07/25/2018 Target  Resolution Date: 08/25/2018 Goal Status: Active Interventions: Provide education on orientation to the wound center Notes: Soft Tissue Infection Nursing Diagnoses: Impaired tissue integrity Goals: Patient's soft tissue infection will resolve Date Initiated: 07/25/2018 Target Resolution  Date: 08/25/2018 Goal Status: Active Interventions: Assess signs and symptoms of infection every visit Treatment Activities: Systemic antibiotics : 07/25/2018 Notes: Venous Leg Ulcer Nursing Diagnoses: Potential for venous Insuffiency (use before diagnosis confirmed) Goals: Patient will maintain optimal edema control Date Initiated: 07/25/2018 Target Resolution Date: 08/25/2018 Goal Status: Active Sierra [name], Sierra [name]. ([id number]) Interventions: Assess peripheral edema status every visit. Provide education on venous insufficiency Notes: Wound/Skin Impairment Nursing Diagnoses: Impaired tissue integrity Knowledge deficit related to smoking impact on wound healing Goals: Ulcer/skin breakdown will have a volume reduction of 30% by week 4 Date Initiated: 07/25/2018 Target Resolution Date: 08/25/2018 Goal Status: Active Interventions: Assess ulceration(s) every visit Treatment Activities: Topical wound management initiated : 07/25/2018 Notes: Electronic Signature(s) Signed: 08/15/2018 6:42:35 PM By: Elliot Gurney, BSN, RN, CWS, Kim RN, BSN Entered By: Elliot Gurney, BSN, RN, CWS, Kim on 08/15/2018 14:33:40 Sierra [name] ([id number]) -------------------------------------------------------------------------------- Pain Assessment Details Patient Name: Sierra [name]. Date of Service: 08/15/2018 1:15 PM Medical Record Number: [id number] Patient Account Number: [id number] Date of Birth/Sex: 11-12-1940 (78 y.o. F) Treating RN: Huel Coventry Primary Care Jamilex Bohnsack: Lucy Chris Other Clinician: Referring Zyaire Dumas: Lucy Chris Treating Jaleyah Longhi/Extender: Altamese [place] in Treatment: 3 Active Problems Location of Pain Severity and Description of Pain Patient Has Paino No Site Locations Pain Management and Medication Current Pain Management: Electronic Signature(s) Signed: 08/15/2018 2:49:55 PM By: Dayton Martes RCP, RRT, CHT Signed: 08/15/2018 6:42:35 PM  By: Elliot Gurney, BSN, RN, CWS, Kim RN, BSN Entered By: Dayton Martes on 08/15/2018 13:18:50 Sierra [name] ([id number]) -------------------------------------------------------------------------------- Patient/Caregiver Education Details Patient Name: Sierra [name]. Date of Service: 08/15/2018 1:15 PM Medical Record Number: [id number] Patient Account Number: [id number] Date of Birth/Gender: 1941/06/20 (78 y.o. F) Treating RN: Huel Coventry Primary Care Physician: Lucy Chris Other Clinician: Referring Physician: Lucy Chris Treating Physician/Extender: Altamese [place] in Treatment: 3 Education Assessment Education Provided To: Patient Education Topics Provided Wound/Skin Impairment: Handouts: Caring for Your Ulcer Methods: Demonstration, Explain/Verbal Responses: State content correctly Electronic Signature(s) Signed: 08/21/2018 11:13:25 AM By: Arnette Norris Previous Signature: 08/15/2018 6:42:35 PM Version By: Elliot Gurney, BSN, RN, CWS, Kim RN, BSN Entered By: Arnette Norris on 08/16/2018 08:42:59 Sierra [name] ([id number]) -------------------------------------------------------------------------------- Wound Assessment Details Patient Name: Sierra [name]. Date of Service: 08/15/2018 1:15 PM Medical Record Number: [id number] Patient Account Number: [id number] Date of Birth/Sex: 12-13-1940 (78 y.o. F) Treating RN: Huel Coventry Primary Care Slayton Lubitz: Lucy Chris Other Clinician: Referring Mieczyslaw Stamas: Lucy Chris Treating Tattianna Schnarr/Extender: Altamese [place] in Treatment: 3 Wound Status Wound Number: 1 Primary Etiology: Lymphedema Wound Location: Left, Medial Lower Leg Wound Status: Healed - Epithelialized Wounding Event: Gradually Appeared Comorbid History: Anemia Date Acquired: 07/24/2018 Weeks Of Treatment: 2 Clustered Wound: No Photos Photo Uploaded By: Rodell Perna on 08/17/2018 16:00:49 Wound  Measurements Length: (cm) 0 % Reduct Width: (cm) 0 % Reduct Depth: (cm) 0 Epitheli Area: (cm) 0 Tunneli Volume: (cm) 0 Undermi ion in Area: 100% ion in Volume: 100% alization: Large (67-100%) ng: No ning: No Wound Description Classification: Partial Thickness Foul Odo Wound Margin: Indistinct, nonvisible Slough/F Exudate Amount: Large Exudate Type: Serous Exudate Color: amber r After Cleansing: No ibrino No Wound Bed Granulation Amount: None Present (0%) Exposed Structure Necrotic Amount: None Present (0%) Fascia Exposed: No Fat Layer (Subcutaneous Tissue) Exposed:  No Tendon Exposed: No Muscle Exposed: No Joint Exposed: No Bone Exposed: No Limited to Skin Breakdown Periwound Skin Texture Sierra [name], Sierra [name]. ([id number]) Texture Color No Abnormalities Noted: No No Abnormalities Noted: No Callus: No Atrophie Blanche: No Crepitus: No Cyanosis: No Excoriation: No Ecchymosis: No Induration: No Erythema: No Rash: No Hemosiderin Staining: Yes Scarring: No Mottled: No Pallor: No Moisture Rubor: No No Abnormalities Noted: No Dry / Scaly: No Temperature / Pain Maceration: No Temperature: No Abnormality Tenderness on Palpation: Yes Wound Preparation Ulcer Cleansing: Other: soap and water, Topical Anesthetic Applied: None Electronic Signature(s) Signed: 08/15/2018 6:42:35 PM By: Elliot Gurney, BSN, RN, CWS, Kim RN, BSN Entered By: Elliot Gurney, BSN, RN, CWS, Kim on 08/15/2018 14:35:57 Sierra [name] ([id number]) -------------------------------------------------------------------------------- Wound Assessment Details Patient Name: Sierra [name]. Date of Service: 08/15/2018 1:15 PM Medical Record Number: [id number] Patient Account Number: [id number] Date of Birth/Sex: January 29, 1941 (78 y.o. F) Treating RN: Huel Coventry Primary Care Monay Houlton: Lucy Chris Other Clinician: Referring Zetha Kuhar: Lucy Chris Treating Lawrnce Reyez/Extender: Altamese [place] in Treatment: 3 Wound Status Wound Number: 2 Primary Etiology: Lymphedema Wound Location: Right, Lateral Lower Leg Wound Status: Healed - Epithelialized Wounding Event: Gradually Appeared Comorbid History: Anemia Date Acquired: 07/24/2018 Weeks Of Treatment: 2 Clustered Wound: No Photos Photo Uploaded By: Rodell Perna on 08/17/2018 16:00:22 Wound Measurements Length: (cm) 0 % Reduct Width: (cm) 0 % Reduct Depth: (cm) 0 Epitheli Area: (cm) 0 Tunneli Volume: (cm) 0 Undermi ion in Area: 100% ion in Volume: 100% alization: Large (67-100%) ng: No ning: No Wound Description Classification: Partial Thickness Foul Odo Wound Margin: Indistinct, nonvisible Slough/F Exudate Amount: Large Exudate Type: Serous Exudate Color: amber r After Cleansing: No ibrino No Wound Bed Granulation Amount: None Present (0%) Exposed Structure Necrotic Amount: None Present (0%) Fascia Exposed: No Fat Layer (Subcutaneous Tissue) Exposed: No Tendon Exposed: No Muscle Exposed: No Joint Exposed: No Bone Exposed: No Limited to Skin Breakdown Periwound Skin Texture Sierra [name], Sierra [name]. ([id number]) Texture Color No Abnormalities Noted: No No Abnormalities Noted: No Callus: No Atrophie Blanche: No Crepitus: No Cyanosis: No Excoriation: No Ecchymosis: No Induration: No Erythema: No Rash: No Hemosiderin Staining: Yes Scarring: No Mottled: No Pallor: No Moisture Rubor: No No Abnormalities Noted: No Dry / Scaly: No Temperature / Pain Maceration: No Temperature: No Abnormality Wound Preparation Ulcer Cleansing: Other: soap and water, Topical Anesthetic Applied: None Electronic Signature(s) Signed: 08/15/2018 6:42:35 PM By: Elliot Gurney, BSN, RN, CWS, Kim RN, BSN Entered By: Elliot Gurney, BSN, RN, CWS, Kim on 08/15/2018 16:18:02 Sierra [name] ([id number]) -------------------------------------------------------------------------------- Wound Assessment Details Patient Name:  Sierra [name]. Date of Service: 08/15/2018 1:15 PM Medical Record Number: [id number] Patient Account Number: [id number] Date of Birth/Sex: [birth year] (78 y.o. F) Treating RN: Rodell Perna Primary Care Nissan Frazzini: Lucy Chris Other Clinician: Referring Shaquandra Galano: Lucy Chris Treating Nathanal Hermiz/Extender: Maxwell Caul Weeks in Treatment: 3 Wound Status Wound Number: 3 Primary Etiology: Venous Leg Ulcer Wound Location: Left Lower Leg - Lateral Wound Status: Open Wounding Event: Other Lesion Comorbid History: Anemia Date Acquired: 08/09/2018 Weeks Of Treatment: 0 Clustered Wound: No Photos Photo Uploaded By: Rodell Perna on 08/17/2018 16:00:22 Wound Measurements Length: (cm) 3.3 Width: (cm) 1.7 Depth: (cm) 0.1 Area: (cm) 4.406 Volume: (cm) 0.441 % Reduction in Area: 0% % Reduction in Volume: 0% Epithelialization: None Tunneling: No Undermining: No Wound Description Full Thickness Without Exposed Support Foul Odo Classification: Structures Due to P Wound Margin: Distinct, outline attached Slough/F Exudate Medium Amount: Exudate Type: Serous Exudate Color: amber r After Cleansing:  Yes roduct Use: No ibrino Yes Wound Bed Granulation Amount: Small (1-33%) Exposed Structure Granulation Quality: Red Fascia Exposed: No Necrotic Amount: Small (1-33%) Fat Layer (Subcutaneous Tissue) Exposed: Yes Necrotic Quality: Adherent Slough Tendon Exposed: No Muscle Exposed: No Joint Exposed: No Bone Exposed: No Sierra [name], Sierra Jasminemarie M. ([id number]) Periwound Skin Texture Texture Color No Abnormalities Noted: No No Abnormalities Noted: No Callus: No Atrophie Blanche: No Crepitus: No Cyanosis: No Excoriation: No Ecchymosis: No Induration: No Erythema: Yes Rash: No Hemosiderin Staining: No Scarring: No Mottled: No Pallor: No Moisture Rubor: No No Abnormalities Noted: No Dry / Scaly: Yes Temperature / Pain Maceration: No Temperature: No  Abnormality Tenderness on Palpation: Yes Wound Preparation Ulcer Cleansing: Rinsed/Irrigated with Saline, Other: soap and water, Topical Anesthetic Applied: Other: lidocaine 4%, Treatment Notes Wound #3 (Left, Lateral Lower Leg) Notes TCA, Silver alginate, Anola Gurney, 4-layer Left Electronic Signature(s) Signed: 08/17/2018 4:09:31 PM By: Rodell Perna Entered By: Rodell Perna on 08/15/2018 13:43:58 Sierra [name] ([id number]) -------------------------------------------------------------------------------- Vitals Details Patient Name: Sierra [name]. Date of Service: 08/15/2018 1:15 PM Medical Record Number: [id number] Patient Account Number: [id number] Date of Birth/Sex: [birth year] (78 y.o. F) Treating RN: Huel Coventry Primary Care Shilah Hefel: Lucy Chris Other Clinician: Referring Mellany Dinsmore: Lucy Chris Treating Syvanna Ciolino/Extender: Altamese [place] in Treatment: 3 Vital Signs Time Taken: 13:18 Temperature (F): 97.6 Height (in): 66 Pulse (bpm): 94 Weight (lbs): 208 Respiratory Rate (breaths/min): 18 Body Mass Index (BMI): 33.6 Blood Pressure (mmHg): 183/80 Reference Range: 80 - 120 mg / dl Electronic Signature(s) Signed: 08/15/2018 2:49:55 PM By: Dayton Martes RCP, RRT, CHT Entered By: Dayton Martes on 08/15/2018 13:23:59

## 2018-08-22 ENCOUNTER — Encounter: Payer: Medicare Other | Admitting: Internal Medicine

## 2018-08-22 ENCOUNTER — Other Ambulatory Visit: Payer: Self-pay | Admitting: Family Medicine

## 2018-08-22 DIAGNOSIS — I87333 Chronic venous hypertension (idiopathic) with ulcer and inflammation of bilateral lower extremity: Secondary | ICD-10-CM | POA: Diagnosis not present

## 2018-08-22 DIAGNOSIS — Z780 Asymptomatic menopausal state: Secondary | ICD-10-CM

## 2018-08-23 NOTE — Progress Notes (Signed)
Sierra [name], Sierra [name] ([id number]) Visit Report for 08/22/2018 HPI Details Patient Name: Sierra [name], Sierra [name]. Date of Service: 08/22/2018 2:00 PM Medical Record Number: [id number] Patient Account Number: [id number] Date of Birth/Sex: 04/21/1941 (77 y.o. F) Treating RN: Huel Coventry Primary Care Provider: Lucy Chris Other Clinician: Referring Provider: Lucy Chris Treating Provider/Extender: Altamese [place] in Treatment: 4 History of Present Illness HPI Description: Admission 07/25/2018 Patient is a 78 year old woman who lives alone in [place]. She was referred here after recently establishing primary care. I get the sense that she has been ignoring medical care for a number of years. Her appointment was sometime last week. It was notable that she had erythema and tenderness predominantly in her left posterior greater than right posterior calf and an antibiotic was prescribed although she still has not picked this up. She plans to do this later today. We do not have records from her primary doctor and I do not know exactly what antibiotic. The history she provides is extremely vague. She talks about increased swelling and erythema in her legs in the last several weeks to 2 months although I suspect it is been there longer than that. She walks with a walker. The patient has weeping areas in the right posterior calf but the most impressive area is a large area in the left posterior calf. 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 She is complaining of pain in her posterior calves bilaterally. Past medical history; there is nothing on her in [place] link. 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 The patient has lost some weight but she states she has been trying to lose some weight. She has quit smoking in 1994. There is no records of her being a diabetic ABIs were noncompressible bilaterally 2/5;. The patient did not want home health in her home. I am not exactly sure what the issue was. She has large areas on the left greater than right posterior calf in the setting of chronic venous inflammation with secondary lymphedema. Her wounds are about the same although her edema control is better. She has her arterial studies on 2/7 2/12; patient's arterial studies were actually quite good with an ABI of 1.28 on the right with triphasic waveforms and 1.31 on the left also with triphasic waveforms. Not felt to have significant arterial disease. The patient has smaller areas on the posterior right calf draining area on the left anterior tibial area and the left lateral calf. And flaky. 2/19; patient's arterial studies were quite normal last week so we increased her to 3 layer compression which she tolerated fairly well. She comes in with the dressing very wet which she claims are from weeping of the wounds. She has 2 new wounds on the upper left lateral tibial area. 2/26; we put 4 layer compression on her last week and the wounds on the upper left lateral tibia are closed. She can be discharged to her own stockings and skin lubrication with Warden/ranger) Signed: 08/22/2018 4:42:30 PM By: Baltazar Najjar MD Entered By: Baltazar Najjar on 08/22/2018 14:50:49 Sierra [name] ([id number]) -------------------------------------------------------------------------------- Physical Exam Details Patient Name: Sierra [name]. Date of Service: 08/22/2018 2:00 PM Medical Record Number:  [id number] Patient Account Number: [id number] Date of Birth/Sex: [birth year] (77 y.o. F) Treating RN: Huel Coventry Primary  Care Provider: Lucy Chris Other Clinician: Referring Provider: Lucy Chris Treating Provider/Extender: Altamese [place] in Treatment: 4 Constitutional Sitting or standing Blood Pressure is within target range for patient.. Pulse regular and within target range for patient.Marland Kitchen Respirations regular, non-labored and within target range.. Temperature is normal and within the target range for the patient.Marland Kitchen appears in no distress. Notes Wound exam; the area in question on the left anterior tibia just below the tibial process is closed. There is excellent edema control in her legs. Electronic Signature(s) Signed: 08/22/2018 4:42:30 PM By: Baltazar Najjar MD Entered By: Baltazar Najjar on 08/22/2018 14:51:40 Sierra [name] ([id number]) -------------------------------------------------------------------------------- Physician Orders Details Patient Name: Sierra [name]. Date of Service: 08/22/2018 2:00 PM Medical Record Number: [id number] Patient Account Number: [id number] Date of Birth/Sex: [birth year] (77 y.o. F) Treating RN: Huel Coventry Primary Care Provider: Lucy Chris Other Clinician: Referring Provider: Lucy Chris Treating Provider/Extender: Altamese [place] in Treatment: 4 Verbal / Phone Orders: No Diagnosis Coding Edema Control Wound #3 Left,Lateral Lower Leg o Patient to wear own compression stockings Discharge From [facility] Services Wound #3 Left,Lateral Lower Leg o Discharge from Wound Care Center - treatment complete Electronic Signature(s) Signed: 08/22/2018 4:42:30 PM By: Baltazar Najjar MD Signed: 08/22/2018 5:55:39 PM By: Elliot Gurney, BSN, RN, CWS, Kim RN, BSN Entered By: Elliot Gurney, BSN, RN, CWS, Kim on 08/22/2018 14:48:15 Sierra [name] ([id number]) -------------------------------------------------------------------------------- Problem List Details Patient Name: Sierra [name], Sierra [name]. Date of Service: 08/22/2018 2:00 PM Medical  Record Number: [id number] Patient Account Number: [id number] Date of Birth/Sex: 12/14/1940 (77 y.o. F) Treating RN: Huel Coventry Primary Care Provider: Lucy Chris Other Clinician: Referring Provider: Lucy Chris Treating Provider/Extender: Altamese [place] in Treatment: 4 Active Problems ICD-10 Evaluated Encounter Code Description Active Date Today Diagnosis L97.221 Non-pressure chronic ulcer of left calf limited to breakdown of 07/25/2018 No Yes skin I87.333 Chronic venous hypertension (idiopathic) with ulcer and 07/25/2018 No Yes inflammation of bilateral lower extremity Inactive Problems Resolved Problems ICD-10 Code Description Active Date Resolved Date L97.211 Non-pressure chronic ulcer of right calf limited to breakdown of skin 07/25/2018 07/25/2018 L03.116 Cellulitis of left lower limb 07/25/2018 07/25/2018 Electronic Signature(s) Signed: 08/22/2018 4:42:30 PM By: Baltazar Najjar MD Entered By: Baltazar Najjar on 08/22/2018 14:49:49 Sierra [name] ([id number]) -------------------------------------------------------------------------------- Progress Note Details Patient Name: Sierra [name]. Date of Service: 08/22/2018 2:00 PM Medical Record Number: [id number] Patient Account Number: [id number] Date of Birth/Sex: 1941-01-20 (77 y.o. F) Treating RN: Huel Coventry Primary Care Provider: Lucy Chris Other Clinician: Referring Provider: Lucy Chris Treating Provider/Extender: Altamese [place] in Treatment: 4 Subjective History of Present Illness (HPI) Admission 07/25/2018 Patient is a 78 year old woman who lives alone in [place]. She was referred here after recently establishing primary care. I get the sense that she has been ignoring medical care for a number of years. Her appointment was sometime last week. It was notable that she had erythema and tenderness predominantly in her left posterior greater than right  posterior calf and an antibiotic was prescribed although she still has not picked this up. She plans to do this later today. We do not have records from her primary doctor and I do not know exactly what antibiotic. The history she provides is extremely vague. She talks about increased swelling and erythema in her legs in the last several weeks to 2 months although I suspect it is been  there longer than that. She walks with a walker. The patient has weeping areas in the right posterior calf but the most impressive area is a large area in the left posterior calf. 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 She is complaining of pain in her posterior calves bilaterally. Past medical history; there is nothing on her in [place] link. 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 The patient has lost some weight but she states she has been trying to lose some weight. She has quit smoking in 1994. There is no records of her being a diabetic ABIs were noncompressible bilaterally 2/5;. The patient did not want home health in her home. I am not exactly sure what the issue was. She has large areas on the left greater than right posterior calf in the setting of chronic venous inflammation with secondary lymphedema. Her wounds are about the same although her edema control is better. She has her arterial studies on 2/7 2/12; patient's arterial studies were actually quite good with an ABI of 1.28 on the right with triphasic waveforms and 1.31 on the left also with triphasic waveforms. Not felt to have significant arterial disease. The patient has smaller areas on the posterior right  calf draining area on the left anterior tibial area and the left lateral calf. And flaky. 2/19; patient's arterial studies were quite normal last week so we increased her to 3 layer compression which she tolerated fairly well. She comes in with the dressing very wet which she claims are from weeping of the wounds. She has 2 new wounds on the upper left lateral tibial area. 2/26; we put 4 layer compression on her last week and the wounds on the upper left lateral tibia are closed. She can be discharged to her own stockings and skin lubrication with [place] Objective Constitutional Sitting or standing Blood Pressure is within target range for patient.. Pulse regular and within target range for patient.Sierra [name], Sierra [name] ([id number]) Respirations regular, non-labored and within target range.. Temperature is normal and within the target range for the patient.Marland Kitchen appears in no distress. Vitals Time Taken: 2:15 PM, Height: 66 in, Weight: 208 lbs, BMI: 33.6, Temperature: 97.7 F, Pulse: 85 bpm, Respiratory Rate: 18 breaths/min, Blood Pressure: 138/65 mmHg. General Notes: Wound exam; the area in question on the left anterior tibia just below the tibial process is closed. There is excellent edema control in her legs. Integumentary (Hair, Skin) Wound #3 status is Healed - Epithelialized. Original cause of wound was Other Lesion. The wound is located on the Left,Lateral Lower Leg. The wound measures 0cm length x 0cm width x 0cm depth; 0cm^2 area and 0cm^3 volume. There is Fat Layer (Subcutaneous Tissue) Exposed exposed. There is no tunneling or undermining noted. There is a none present amount of drainage noted. Foul odor after cleansing was noted. The wound margin is distinct with the outline attached to the wound base. There is small (1-33%) red granulation within the wound bed. There is a small (1-33%) amount of necrotic tissue within the wound bed including Adherent Slough. The periwound skin  appearance exhibited: Dry/Scaly, Erythema. The periwound skin appearance did not exhibit: Callus, Crepitus, Excoriation, Induration, Rash, Scarring, Maceration, Atrophie Blanche,  Cyanosis, Ecchymosis, Hemosiderin Staining, Mottled, Pallor, Rubor. The surrounding wound skin color is noted with erythema. Periwound temperature was noted as No Abnormality. The periwound has tenderness on palpation. 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 The patient can be discharged from the clinic 2. She has her stockings for the bilateral legs she will use Jergins skin cream Electronic Signature(s) Signed: 08/22/2018 4:42:30 PM By: Baltazar Najjar MD Entered By: Baltazar Najjar on 08/22/2018 14:53:08 Sierra [name], Sierra [name] ([id number]) Sierra [name], Sierra [name]. ([id number]) -------------------------------------------------------------------------------- SuperBill Details Patient Name: Sierra [name]. Date of Service: 08/22/2018 Medical Record Number: [id number] Patient Account Number: [id number] Date of Birth/Sex: 1940/11/15 (78 y.o. F) Treating RN: Huel Coventry Primary Care Provider: Lucy Chris Other Clinician: Referring Provider: Lucy Chris Treating Provider/Extender: Altamese [place] in Treatment: 4 Diagnosis Coding ICD-10 Codes Code Description [phone] Non-pressure chronic ulcer of left calf limited to breakdown of skin I87.333 Chronic venous hypertension (idiopathic) with ulcer and inflammation of bilateral lower extremity Facility Procedures CPT4 Code: [id number] Description: [phone] - WOUND CARE VISIT-LEV 2  EST PT Modifier: Quantity: 1 Physician Procedures CPT4: Description Modifier Quantity Code [id number] - WC PHYS LEVEL 2 - EST PT 1 ICD-10 Diagnosis Description L97.221 Non-pressure chronic ulcer of left calf limited to breakdown of skin I87.333 Chronic venous hypertension (idiopathic) with ulcer and  inflammation of bilateral lower extremity Electronic Signature(s) Signed: 08/22/2018 4:42:30 PM By: Baltazar Najjar MD Entered By: Baltazar Najjar on 08/22/2018 14:53:28

## 2018-08-24 NOTE — Progress Notes (Signed)
ELLAGRACE, YOSHIDA ([id number]) Visit Report for 08/22/2018 Arrival Information Details Patient Name: Sierra, [name]. Date of Service: 08/22/2018 2:00 PM Medical Record Number: [id number] Patient Account Number: [id number] Date of Birth/Sex: 07/10/1940 (78 y.o. F) Treating RN: Arnette Norris Primary Care Mackensie Pilson: Lucy Chris Other Clinician: Referring Soham Hollett: Lucy Chris Treating Kyna Blahnik/Extender: Altamese [place] in Treatment: 4 Visit Information History Since Last Visit Added or deleted any medications: No Patient Arrived: Wheel Chair Any new allergies or adverse reactions: No Arrival Time: 14:13 Had a fall or experienced change in No Accompanied By: self activities of daily living that may affect Transfer Assistance: EasyPivot Patient risk of falls: Lift Signs or symptoms of abuse/neglect since last visito No Patient Identification Verified: Yes Hospitalized since last visit: No Secondary Verification Process Yes Has Dressing in Place as Prescribed: Yes Completed: Has Compression in Place as Prescribed: Yes Patient Has Alerts: Yes Pain Present Now: No Patient Alerts: ABI 08/03/2018 AVVS (R) 1.28 (L) 1.3 Electronic Signature(s) Signed: 08/23/2018 7:54:32 AM By: Arnette Norris Entered By: Arnette Norris on 08/22/2018 14:16:26 Sierra [name] ([id number]) -------------------------------------------------------------------------------- Clinic Level of Care Assessment Details Patient Name: Sierra [name]. Date of Service: 08/22/2018 2:00 PM Medical Record Number: [id number] Patient Account Number: [id number] Date of Birth/Sex: [birth year] (78 y.o. F) Treating RN: Huel Coventry Primary Care Ruba Outen: Lucy Chris Other Clinician: Referring Kaidence Callaway: Lucy Chris Treating Fleur Audino/Extender: Altamese [place] in Treatment: 4 Clinic Level of Care Assessment Items TOOL 4 Quantity Score  - Use when only an EandM is performed  on FOLLOW-UP visit 0 ASSESSMENTS - Nursing Assessment / Reassessment  - Reassessment of Co-morbidities (includes updates in patient status) 0 X- 1 5 Reassessment of Adherence to Treatment Plan ASSESSMENTS - Wound and Skin Assessment / Reassessment X - Simple Wound Assessment / Reassessment - one wound 1 5  - 0 Complex Wound Assessment / Reassessment - multiple wounds  - 0 Dermatologic / Skin Assessment (not related to wound area) ASSESSMENTS - Focused Assessment  - Circumferential Edema Measurements - multi extremities 0  - 0 Nutritional Assessment / Counseling / Intervention  - 0 Lower Extremity Assessment (monofilament, tuning fork, pulses)  - 0 Peripheral Arterial Disease Assessment (using hand held doppler) ASSESSMENTS - Ostomy and/or Continence Assessment and Care  - Incontinence Assessment and Management 0  - 0 Ostomy Care Assessment and Management (repouching, etc.) PROCESS - Coordination of Care X - Simple Patient / Family Education for ongoing care 1 15  - 0 Complex (extensive) Patient / Family Education for ongoing care  - 0 Staff obtains Chiropractor, Records, Test Results / Process Orders  - 0 Staff telephones HHA, Nursing Homes / Clarify orders / etc  - 0 Routine Transfer to another Facility (non-emergent condition)  - 0 Routine Hospital Admission (non-emergent condition)  - 0 New Admissions / Manufacturing engineer / Ordering NPWT, Apligraf, etc.  - 0 Emergency Hospital Admission (emergent condition) X- 1 10 Simple Discharge Coordination JARIANA, SHUMARD. ([id number])  - 0 Complex (extensive) Discharge Coordination PROCESS - Special Needs  - Pediatric / Minor Patient Management 0  - 0 Isolation Patient Management  - 0 Hearing / Language / Visual special needs  - 0 Assessment of Community assistance (transportation, D/C planning, etc.)  - 0 Additional assistance / Altered mentation  - 0 Support  Surface(s) Assessment (bed, cushion, seat, etc.) INTERVENTIONS - Wound Cleansing / Measurement X - Simple Wound Cleansing - one wound 1 5  - 0 Complex Wound Cleansing - multiple wounds X- 1  5 Wound Imaging (photographs - any number of wounds) []  - 0 Wound Tracing (instead of photographs) []  - 0 Simple Wound Measurement - one wound X- 1 5 Complex Wound Measurement - multiple wounds INTERVENTIONS - Wound Dressings []  - Small Wound Dressing one or multiple wounds 0 []  - 0 Medium Wound Dressing one or multiple wounds []  - 0 Large Wound Dressing one or multiple wounds []  - 0 Application of Medications - topical []  - 0 Application of Medications - injection INTERVENTIONS - Miscellaneous []  - External ear exam 0 []  - 0 Specimen Collection (cultures, biopsies, blood, body fluids, etc.) []  - 0 Specimen(s) / Culture(s) sent or taken to Lab for analysis []  - 0 Patient Transfer (multiple staff / Nurse, adultHoyer Lift / Similar devices) []  - 0 Simple Staple / Suture removal (25 or less) []  - 0 Complex Staple / Suture removal (26 or more) []  - 0 Hypo / Hyperglycemic Management (close monitor of Blood Glucose) []  - 0 Ankle / Brachial Index (ABI) - do not check if billed separately X- 1 5 Vital Signs Sierra ChancyWETHERELL, [name] ([id number]) Has the patient been seen at the hospital within the last three years: Yes Total Score: 55 Level Of Care: New/Established - Level 2 Electronic Signature(s) Signed: 08/22/2018 5:55:39 PM By: Elliot GurneyWoody, BSN, RN, CWS, Kim RN, BSN Entered By: Elliot GurneyWoody, BSN, RN, CWS, Kim on 08/22/2018 14:49:19 Sierra ChancyWETHERELL, [name] ([id number]) -------------------------------------------------------------------------------- Lower Extremity Assessment Details Patient Name: Sierra ChancyWETHERELL, [name] Date of Service: 08/22/2018 2:00 PM Medical Record Number: [id number] Patient Account Number: [id number] Date of Birth/Sex: [birth year] (77 y.o. F) Treating RN: Arnette NorrisBiell, Kristina Primary Care  Dory Demont: [name], [name] Other Clinician: Referring Akaila Rambo: [name], [name] Treating Jasha Hodzic/Extender: [name], [name] Weeks in Treatment: 4 Edema Assessment Assessed: [Left: No] [Right: No] [Left: Edema] [Right: :] Calf Left: Right: Point of Measurement: 32 cm From Medial Instep 47.5 cm cm Ankle Left: Right: Point of Measurement: 12 cm From Medial Instep 25 cm cm Vascular Assessment Pulses: Dorsalis Pedis Palpable: [Left:Yes] Posterior Tibial Palpable: [Left:Yes] Extremity colors, hair growth, and conditions: Extremity Color: [Left:Hyperpigmented] Hair Growth on Extremity: [Left:Yes] Temperature of Extremity: [Left:Warm] Capillary Refill: [Left:< 3 seconds] Toe Nail Assessment Left: Right: Thick: Yes Discolored: Yes Deformed: Yes Improper Length and Hygiene: Yes Electronic Signature(s) Signed: 08/23/2018 7:54:32 AM By: Arnette NorrisBiell, Kristina Entered By: Arnette NorrisBiell, Kristina on 08/22/2018 14:31:11 Sierra ChancyWETHERELL, [name] ([id number]) -------------------------------------------------------------------------------- Multi Wound Chart Details Patient Name: Sierra ChancyWETHERELL, [name] Date of Service: 08/22/2018 2:00 PM Medical Record Number: [id number] Patient Account Number: [id number] Date of Birth/Sex: [birth year] (77 y.o. F) Treating RN: Huel CoventryWoody, Kim Primary Care Jacelynn Hayton: [name], [name] Other Clinician: Referring Lasonya Hubner: [name], [name] Treating Neveen Daponte/Extender: [name], [name] Weeks in Treatment: 4 Vital Signs Height(in): 66 Pulse(bpm): 85 Weight(lbs): 208 Blood Pressure(mmHg): 138/65 Body Mass Index(BMI): 34 Temperature(F): 97.7 Respiratory Rate 18 (breaths/min): Photos: [3:No Photos] [N/A:N/A] Wound Location: [3:Left, Lateral Lower Leg] [N/A:N/A] Wounding Event: [3:Other Lesion] [N/A:N/A] Primary Etiology: [3:Venous Leg Ulcer] [N/A:N/A] Comorbid History: [3:Anemia] [N/A:N/A] Date Acquired: [3:08/09/2018] [N/A:N/A] Weeks of Treatment: [3:1] [N/A:N/A] Wound  Status: [3:Healed - Epithelialized] [N/A:N/A] Measurements L x W x D [3:0x0x0] [N/A:N/A] (cm) Area (cm) : [3:0] [N/A:N/A] Volume (cm) : [3:0] [N/A:N/A] % Reduction in Area: [3:100.00%] [N/A:N/A] % Reduction in Volume: [3:100.00%] [N/A:N/A] Classification: [3:Full Thickness Without Exposed Support Structures] [N/A:N/A] Exudate Amount: [3:None Present] [N/A:N/A] Foul Odor After Cleansing: [3:Yes] [N/A:N/A] Odor Anticipated Due to [3:No] [N/A:N/A] Product Use: Wound Margin: [3:Distinct, outline attached] [N/A:N/A] Granulation Amount: [3:Small (1-33%)] [N/A:N/A] Granulation Quality: [3:Red] [N/A:N/A] Necrotic Amount: [3:Small (  1-33%)] [N/A:N/A] Exposed Structures: [3:Fat Layer (Subcutaneous Tissue) Exposed: Yes Fascia: No Tendon: No Muscle: No Joint: No Bone: No] [N/A:N/A] Epithelialization: [3:None] [N/A:N/A] Periwound Skin Texture: [3:Excoriation: No Induration: No Callus: No Crepitus: No] [N/A:N/A] Rash: No Scarring: No Periwound Skin Moisture: Dry/Scaly: Yes N/A N/A Maceration: No Periwound Skin Color: Erythema: Yes N/A N/A Atrophie Blanche: No Cyanosis: No Ecchymosis: No Hemosiderin Staining: No Mottled: No Pallor: No Rubor: No Temperature: No Abnormality N/A N/A Tenderness on Palpation: Yes N/A N/A Wound Preparation: Ulcer Cleansing: N/A N/A Rinsed/Irrigated with Saline, Other: soap and water Topical Anesthetic Applied: Other: lidocaine 4% Treatment Notes Electronic Signature(s) Signed: 08/22/2018 4:42:30 PM By: Baltazar Najjar MD Entered By: Baltazar Najjar on 08/22/2018 14:50:00 Sierra [name] ([id number]) -------------------------------------------------------------------------------- Multi-Disciplinary Care Plan Details Patient Name: Sierra, [name]. Date of Service: 08/22/2018 2:00 PM Medical Record Number: [id number] Patient Account Number: [id number] Date of Birth/Sex: Jul 06, 1940 (77 y.o. F) Treating RN: Huel Coventry Primary Care Lajune Perine:  Lucy Chris Other Clinician: Referring Errol Ala: Lucy Chris Treating Daimian Sudberry/Extender: Altamese [place] in Treatment: 4 Active Inactive Electronic Signature(s) Signed: 08/22/2018 5:40:06 PM By: Elliot Gurney, BSN, RN, CWS, Kim RN, BSN Entered By: Elliot Gurney, BSN, RN, CWS, Kim on 08/22/2018 17:40:06 Sierra [name] ([id number]) -------------------------------------------------------------------------------- Pain Assessment Details Patient Name: Sierra [name]. Date of Service: 08/22/2018 2:00 PM Medical Record Number: [id number] Patient Account Number: [id number] Date of Birth/Sex: 1940/08/28 (77 y.o. F) Treating RN: Arnette Norris Primary Care Rhya Shan: Lucy Chris Other Clinician: Referring Elianis Fischbach: Lucy Chris Treating Rufina Kimery/Extender: Altamese [place] in Treatment: 4 Active Problems Location of Pain Severity and Description of Pain Patient Has Paino No Site Locations Pain Management and Medication Current Pain Management: Electronic Signature(s) Signed: 08/23/2018 7:54:32 AM By: Arnette Norris Entered By: Arnette Norris on 08/22/2018 14:16:41 Sierra [name] ([id number]) -------------------------------------------------------------------------------- Patient/Caregiver Education Details Patient Name: Sierra [name]. Date of Service: 08/22/2018 2:00 PM Medical Record Number: [id number] Patient Account Number: [id number] Date of Birth/Gender: 03-14-1941 (78 y.o. F) Treating RN: Huel Coventry Primary Care Physician: Lucy Chris Other Clinician: Referring Physician: Lucy Chris Treating Physician/Extender: Altamese [place] in Treatment: 4 Education Assessment Education Provided To: Patient Education Topics Provided Wound/Skin Impairment: Handouts: Caring for Your Ulcer Methods: Demonstration, Explain/Verbal Responses: State content correctly Electronic Signature(s) Signed: 08/22/2018 5:55:39 PM By: Elliot Gurney, BSN,  RN, CWS, Kim RN, BSN Entered By: Elliot Gurney, BSN, RN, CWS, Kim on 08/22/2018 14:49:38 Sierra [name] ([id number]) -------------------------------------------------------------------------------- Wound Assessment Details Patient Name: Sierra [name]. Date of Service: 08/22/2018 2:00 PM Medical Record Number: [id number] Patient Account Number: [id number] Date of Birth/Sex: September 12, 1940 (77 y.o. F) Treating RN: Huel Coventry Primary Care Nolia Tschantz: Lucy Chris Other Clinician: Referring Donzell Coller: Lucy Chris Treating Boone Gear/Extender: Maxwell Caul Weeks in Treatment: 4 Wound Status Wound Number: 3 Primary Etiology: Venous Leg Ulcer Wound Location: Left, Lateral Lower Leg Wound Status: Healed - Epithelialized Wounding Event: Other Lesion Comorbid History: Anemia Date Acquired: 08/09/2018 Weeks Of Treatment: 1 Clustered Wound: No Photos Photo Uploaded By: Arnette Norris on 08/22/2018 16:25:04 Wound Measurements Length: (cm) 0 % Red Width: (cm) 0 % Red Depth: (cm) 0 Epith Area: (cm) 0 Tunn Volume: (cm) 0 Unde uction in Area: 100% uction in Volume: 100% elialization: None eling: No rmining: No Wound Description Full Thickness Without Exposed Support Foul Classification: Structures Due t Wound Margin: Distinct, outline attached Sloug Exudate None Present Amount: Odor After Cleansing: Yes o Product Use: No h/Fibrino Yes Wound Bed Granulation Amount: Small (1-33%) Exposed Structure Granulation Quality: Red Fascia Exposed: No Necrotic  Amount: Small (1-33%) Fat Layer (Subcutaneous Tissue) Exposed: Yes Necrotic Quality: Adherent Slough Tendon Exposed: No Muscle Exposed: No Joint Exposed: No Bone Exposed: No Periwound Skin Texture Texture Color Sierra, [name]. ([id number]) No Abnormalities Noted: No No Abnormalities Noted: No Callus: No Atrophie Blanche: No Crepitus: No Cyanosis: No Excoriation: No Ecchymosis: No Induration:  No Erythema: Yes Rash: No Hemosiderin Staining: No Scarring: No Mottled: No Pallor: No Moisture Rubor: No No Abnormalities Noted: No Dry / Scaly: Yes Temperature / Pain Maceration: No Temperature: No Abnormality Tenderness on Palpation: Yes Wound Preparation Ulcer Cleansing: Rinsed/Irrigated with Saline, Other: soap and water, Topical Anesthetic Applied: Other: lidocaine 4%, Electronic Signature(s) Signed: 08/22/2018 5:55:39 PM By: Elliot Gurney, BSN, RN, CWS, Kim RN, BSN Entered By: Elliot Gurney, BSN, RN, CWS, Kim on 08/22/2018 14:48:37 Sierra [name] ([id number]) -------------------------------------------------------------------------------- Vitals Details Patient Name: Sierra [name]. Date of Service: 08/22/2018 2:00 PM Medical Record Number: [id number] Patient Account Number: [id number] Date of Birth/Sex: 1941/04/07 (77 y.o. F) Treating RN: Arnette Norris Primary Care Drayke Grabel: Lucy Chris Other Clinician: Referring Rydell Wiegel: Lucy Chris Treating Willella Harding/Extender: Altamese [place] in Treatment: 4 Vital Signs Time Taken: 14:15 Temperature (F): 97.7 Height (in): 66 Pulse (bpm): 85 Weight (lbs): 208 Respiratory Rate (breaths/min): 18 Body Mass Index (BMI): 33.6 Blood Pressure (mmHg): 138/65 Reference Range: 80 - 120 mg / dl Electronic Signature(s) Signed: 08/23/2018 7:54:32 AM By: Arnette Norris Entered By: Arnette Norris on 08/22/2018 14:17:10

## 2019-03-28 DEATH — deceased
